# Patient Record
Sex: Female | Born: 1980 | Race: White | Hispanic: No | Smoking: Current every day smoker
Health system: Southern US, Community
[De-identification: ages and names within clinical notes are randomized; demographics above are authoritative.]

## PROBLEM LIST (undated history)

## (undated) DIAGNOSIS — F17200 Nicotine dependence, unspecified, uncomplicated: Secondary | ICD-10-CM

## (undated) DIAGNOSIS — E785 Hyperlipidemia, unspecified: Secondary | ICD-10-CM

## (undated) DIAGNOSIS — E079 Disorder of thyroid, unspecified: Secondary | ICD-10-CM

## (undated) DIAGNOSIS — E282 Polycystic ovarian syndrome: Secondary | ICD-10-CM

## (undated) HISTORY — DX: Disorder of thyroid, unspecified: E07.9

## (undated) HISTORY — DX: Hyperlipidemia, unspecified: E78.5

## (undated) HISTORY — PX: OTHER SURGICAL HISTORY: SHX169

## (undated) HISTORY — DX: Nicotine dependence, unspecified, uncomplicated: F17.200

## (undated) HISTORY — DX: Polycystic ovarian syndrome: E28.2

## (undated) HISTORY — PX: LEEP: SHX91

---
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
## 2000-04-02 ENCOUNTER — Emergency Department (HOSPITAL_COMMUNITY): Admission: EM | Admit: 2000-04-02 | Discharge: 2000-04-02 | Payer: Self-pay | Admitting: Emergency Medicine

## 2000-04-02 ENCOUNTER — Encounter: Payer: Self-pay | Admitting: Emergency Medicine

## 2002-04-17 ENCOUNTER — Encounter: Payer: Self-pay | Admitting: Family Medicine

## 2002-04-17 ENCOUNTER — Encounter: Admission: RE | Admit: 2002-04-17 | Discharge: 2002-04-17 | Payer: Self-pay | Admitting: Family Medicine

## 2002-04-20 ENCOUNTER — Other Ambulatory Visit: Admission: RE | Admit: 2002-04-20 | Discharge: 2002-04-20 | Payer: Self-pay | Admitting: Obstetrics and Gynecology

## 2002-11-13 ENCOUNTER — Other Ambulatory Visit: Admission: RE | Admit: 2002-11-13 | Discharge: 2002-11-13 | Payer: Self-pay | Admitting: Obstetrics and Gynecology

## 2003-05-25 ENCOUNTER — Other Ambulatory Visit: Admission: RE | Admit: 2003-05-25 | Discharge: 2003-05-25 | Payer: Self-pay | Admitting: Obstetrics and Gynecology

## 2012-01-01 ENCOUNTER — Ambulatory Visit: Payer: Self-pay | Admitting: Obstetrics and Gynecology

## 2012-01-04 ENCOUNTER — Ambulatory Visit: Payer: Self-pay | Admitting: Obstetrics and Gynecology

## 2012-10-13 ENCOUNTER — Ambulatory Visit
Admission: RE | Admit: 2012-10-13 | Discharge: 2012-10-13 | Disposition: A | Discharge status: Home / Self Care | Payer: PRIVATE HEALTH INSURANCE | Source: Ambulatory Visit | Attending: Obstetrics and Gynecology | Admitting: Obstetrics and Gynecology

## 2012-10-13 ENCOUNTER — Other Ambulatory Visit (HOSPITAL_COMMUNITY)
Admission: RE | Admit: 2012-10-13 | Discharge: 2012-10-13 | Disposition: A | Discharge status: Home / Self Care | Payer: PRIVATE HEALTH INSURANCE | Source: Ambulatory Visit | Attending: Obstetrics and Gynecology | Admitting: Obstetrics and Gynecology

## 2012-10-13 ENCOUNTER — Other Ambulatory Visit: Payer: Self-pay | Admitting: Obstetrics and Gynecology

## 2012-10-13 DIAGNOSIS — Z1151 Encounter for screening for human papillomavirus (HPV): Secondary | ICD-10-CM | POA: Insufficient documentation

## 2012-10-13 DIAGNOSIS — E049 Nontoxic goiter, unspecified: Secondary | ICD-10-CM

## 2012-10-13 DIAGNOSIS — Z01419 Encounter for gynecological examination (general) (routine) without abnormal findings: Secondary | ICD-10-CM | POA: Insufficient documentation

## 2013-10-19 ENCOUNTER — Other Ambulatory Visit: Payer: Self-pay | Admitting: Internal Medicine

## 2013-10-19 DIAGNOSIS — E042 Nontoxic multinodular goiter: Secondary | ICD-10-CM

## 2013-10-23 ENCOUNTER — Ambulatory Visit
Admission: RE | Admit: 2013-10-23 | Discharge: 2013-10-23 | Disposition: A | Discharge status: Home / Self Care | Payer: PRIVATE HEALTH INSURANCE | Source: Ambulatory Visit | Attending: Internal Medicine | Admitting: Internal Medicine

## 2013-10-23 DIAGNOSIS — E042 Nontoxic multinodular goiter: Secondary | ICD-10-CM

## 2014-07-24 IMAGING — US US SOFT TISSUE HEAD/NECK
1 series · 14 of 25 positions shown · non-contrast
Comparison: None.

CLINICAL DATA: Enlarged thyroid.

THYROID ULTRASOUND
TECHNIQUE: Ultrasound examination of the thyroid gland and adjacent
soft tissues was performed.

[Series 1: us soft tissue head/neck · 0.08mm/px · 14 of 50 slices shown]
[im 1/50]
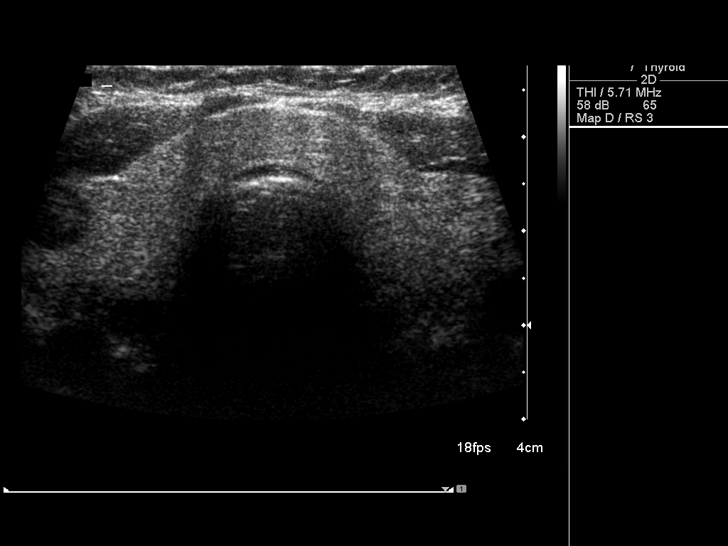
[im 5/50]
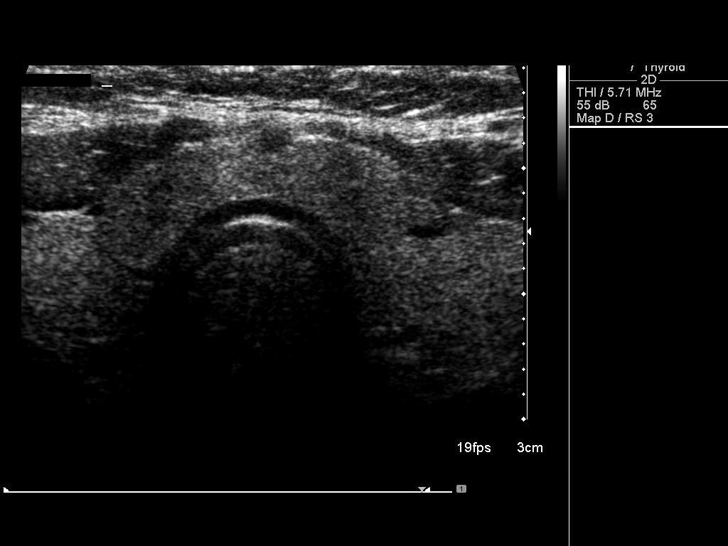
[im 9/50]
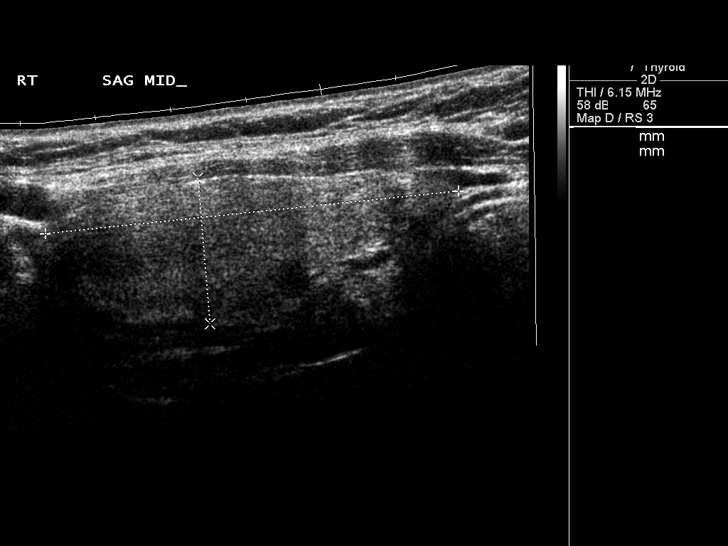
[im 13/50]
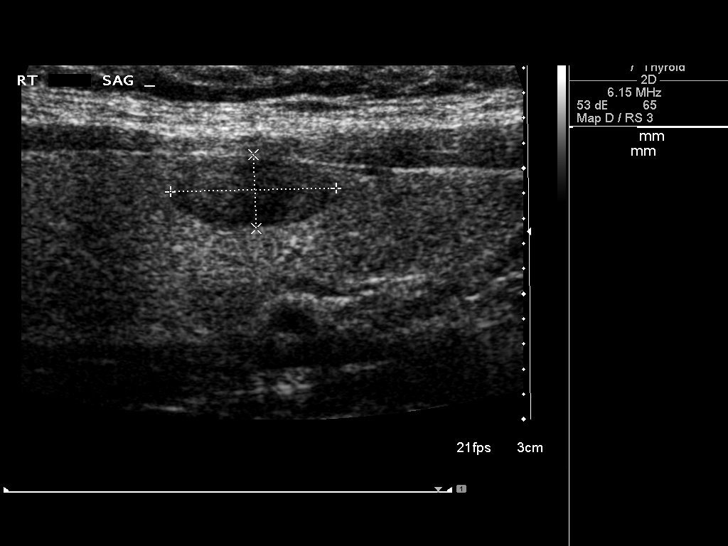
[im 17/50]
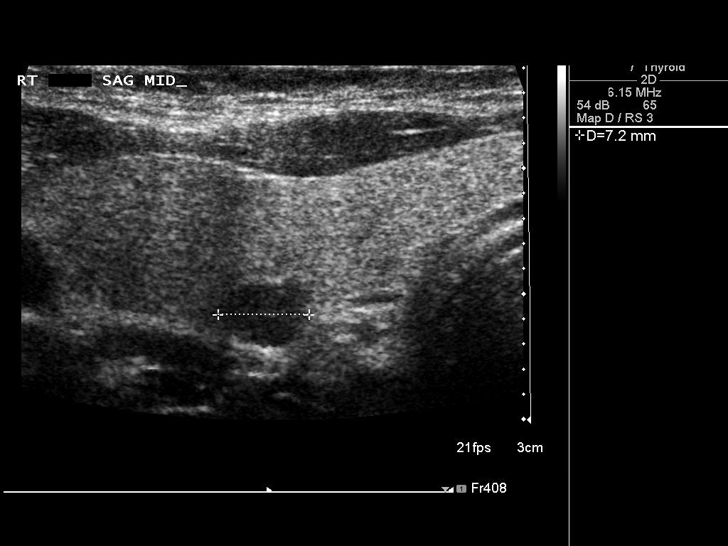
[im 19/50]
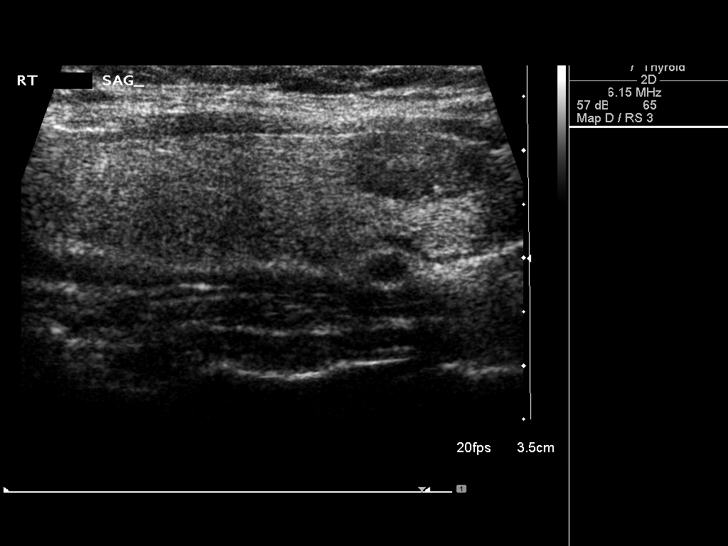
[im 23/50]
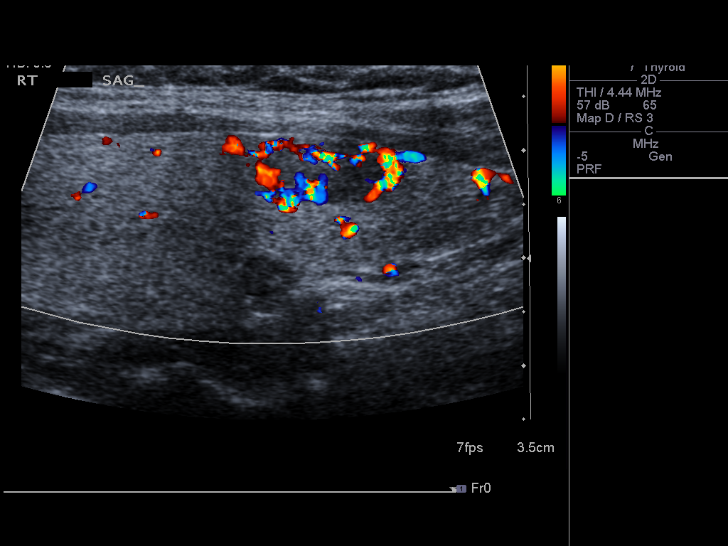
[im 27/50]
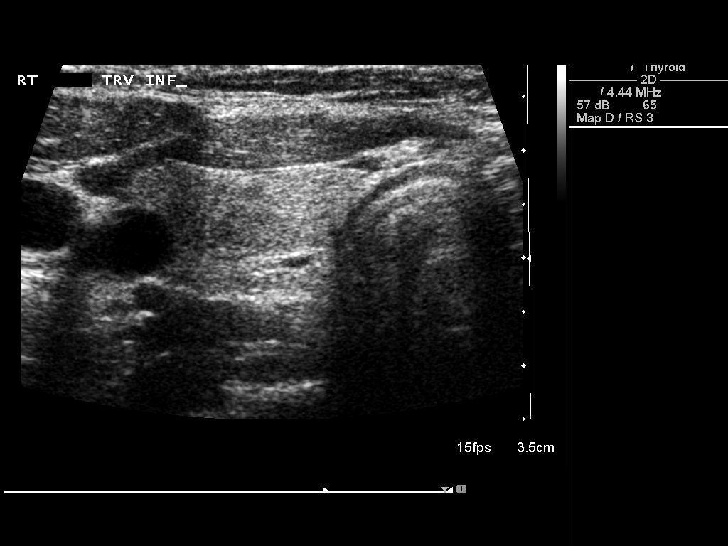
[im 31/50]
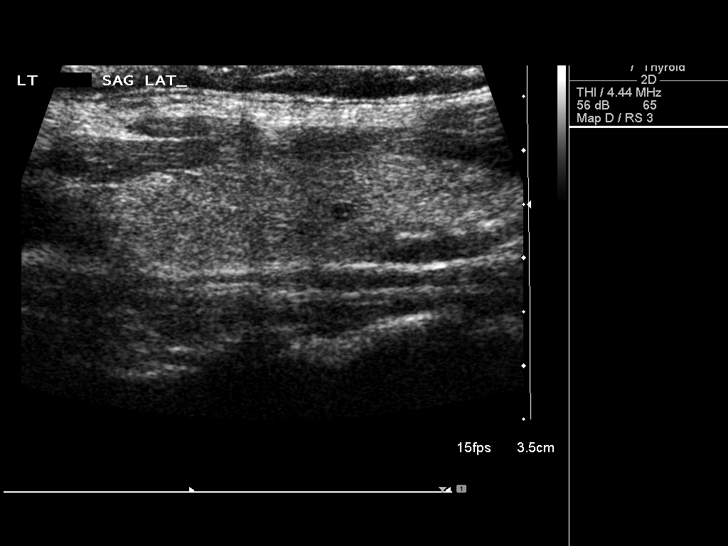
[im 33/50]
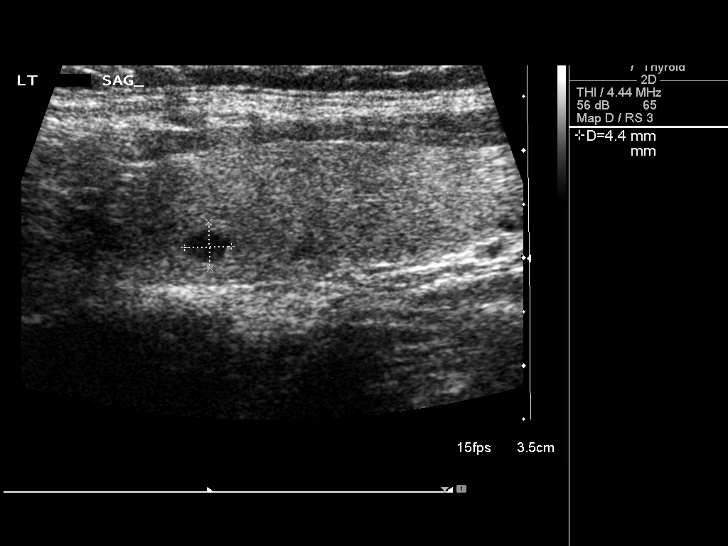
[im 37/50]
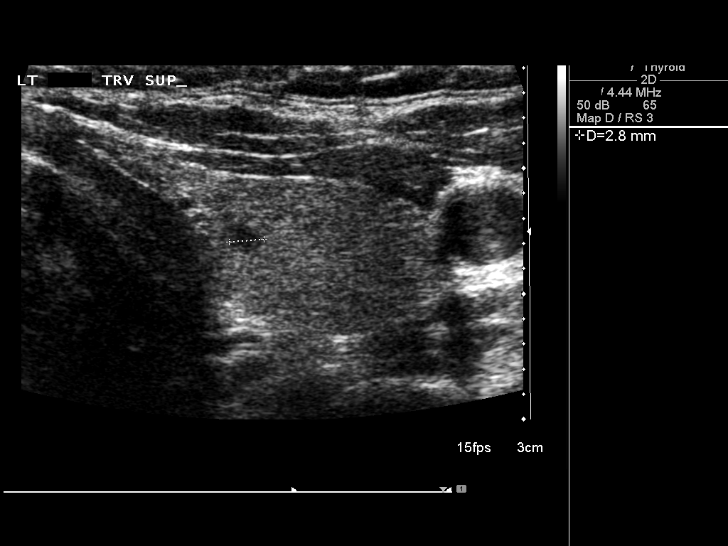
[im 41/50]
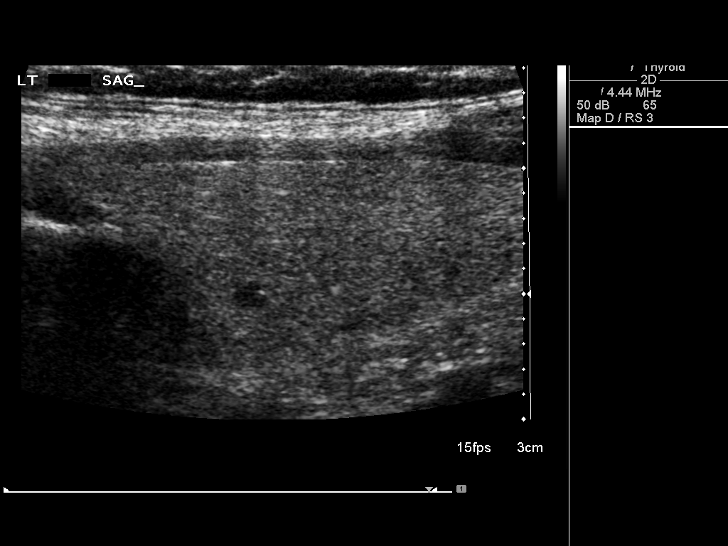
[im 45/50]
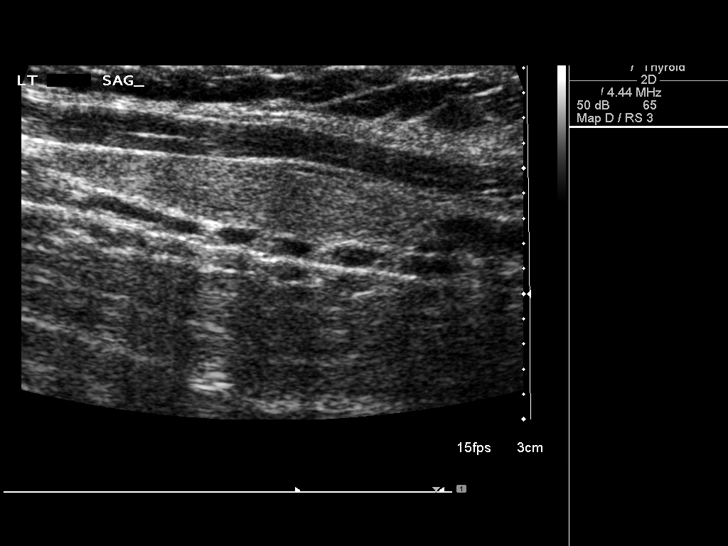
[im 50/50]
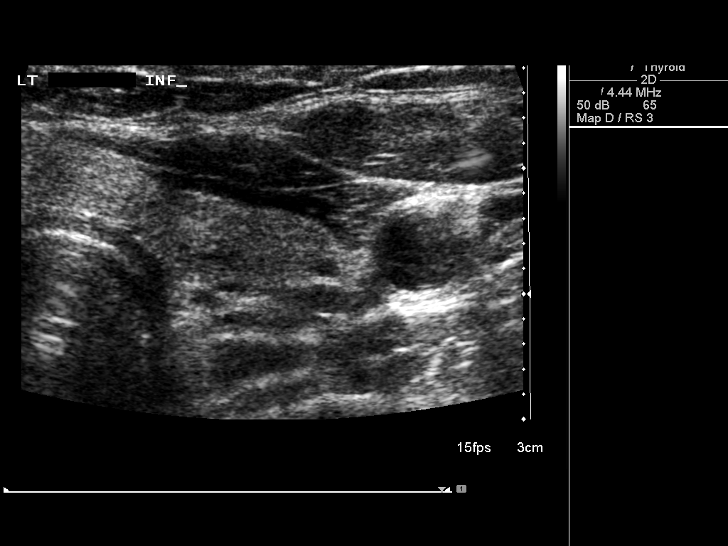

[14 of 25 positions shown; findings below may reference images not displayed]

FINDINGS: Right thyroid lobe:  Measures 5.5 x 1.9 x 2.4 cm, heterogeneous in
echotexture.
Left thyroid lobe:  Measures 5.3 x 1.5 x 2.1 cm, heterogeneous in
echotexture.
Isthmus:  Measures 7 mm.

Focal nodules:

Solid nodule, right lower pole, 1.3 x 0.6 x 1.2 cm (image 14).

Additional hypoechoic and solid nodules measure 7 mm or less in
size bilaterally.

Lymphadenopathy:  None visualized.
IMPRESSION: 1.  Slightly enlarged thyroid.
2.  Scattered bilateral thyroid nodules. Findings do not meet
current SRU consensus criteria for biopsy.  Follow-up by clinical
exam is recommended.  If patient has known risk factors for thyroid
carcinoma, consider follow-up ultrasound in 12 months.  If patient
is clinically hyperthyroid, consider nuclear medicine thyroid
uptake and scan.

## 2015-08-03 IMAGING — US US SOFT TISSUE HEAD/NECK
1 series · 14 of 25 positions shown · non-contrast
Comparison: 10/13/2012

CLINICAL DATA: Followup of thyroid goiter.

EXAM:
THYROID ULTRASOUND
TECHNIQUE: Ultrasound examination of the thyroid gland and adjacent soft
tissues was performed.

[Series 1: us soft tissue head/neck · 0.08mm/px · 14 of 61 slices shown]
[im 1/61]
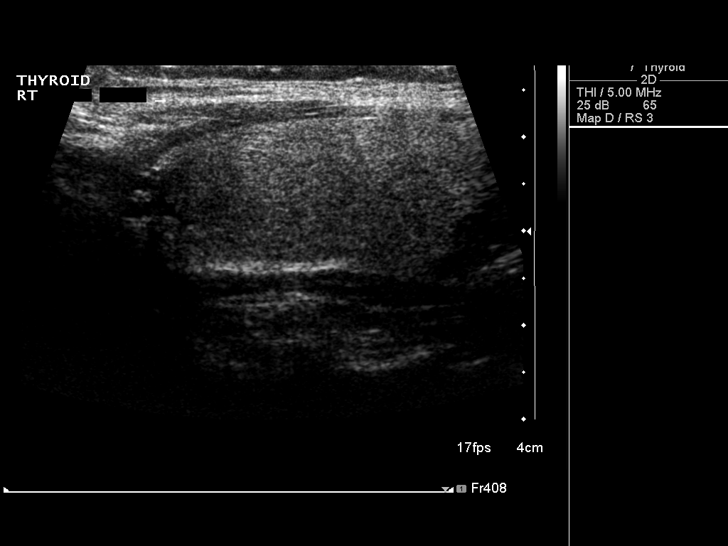
[im 6/61]
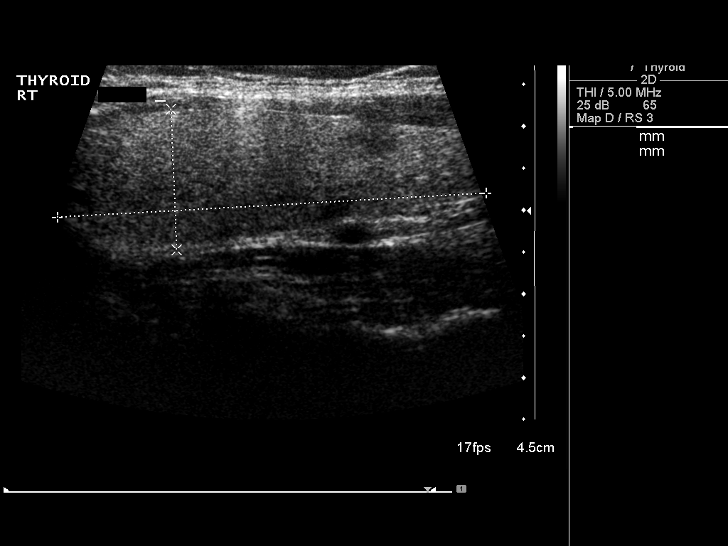
[im 11/61]
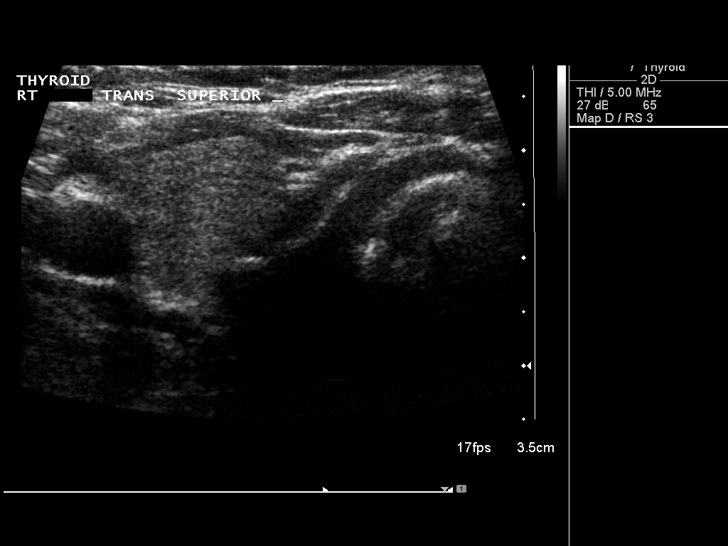
[im 16/61]
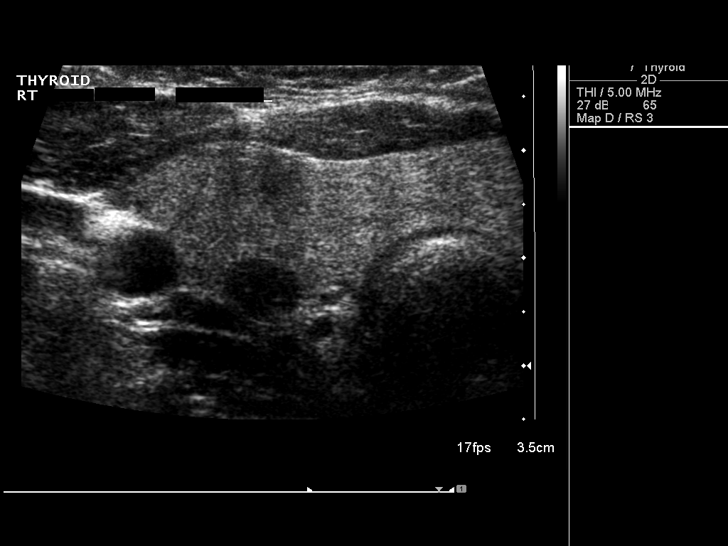
[im 21/61]
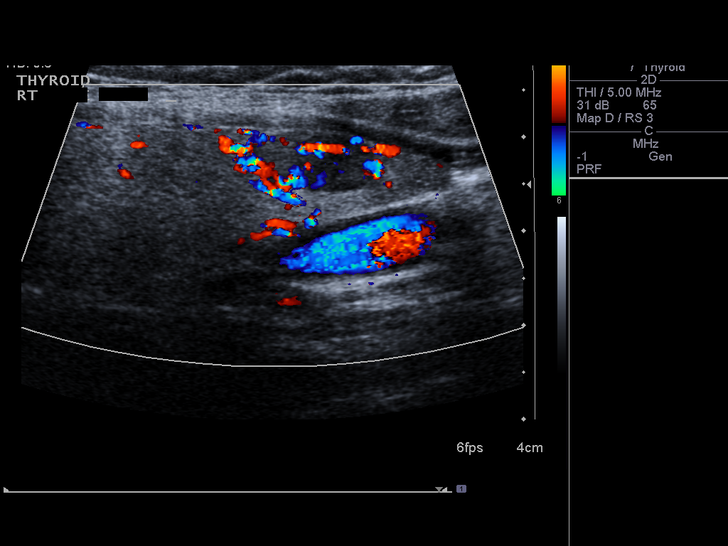
[im 23/61]
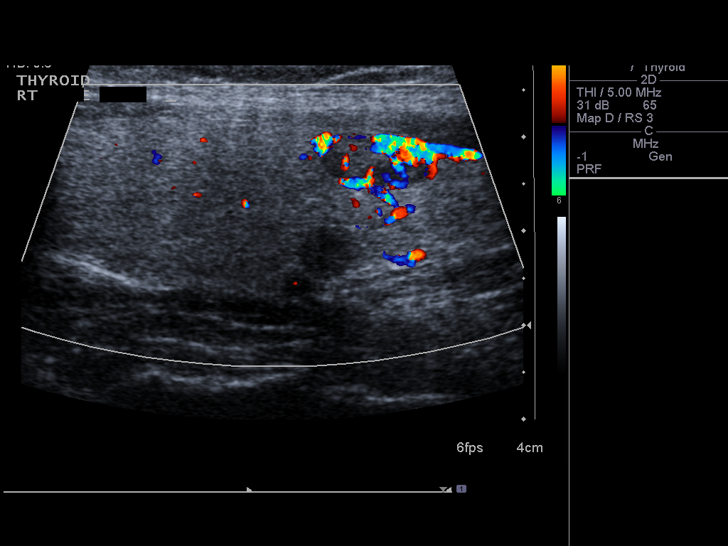
[im 28/61]
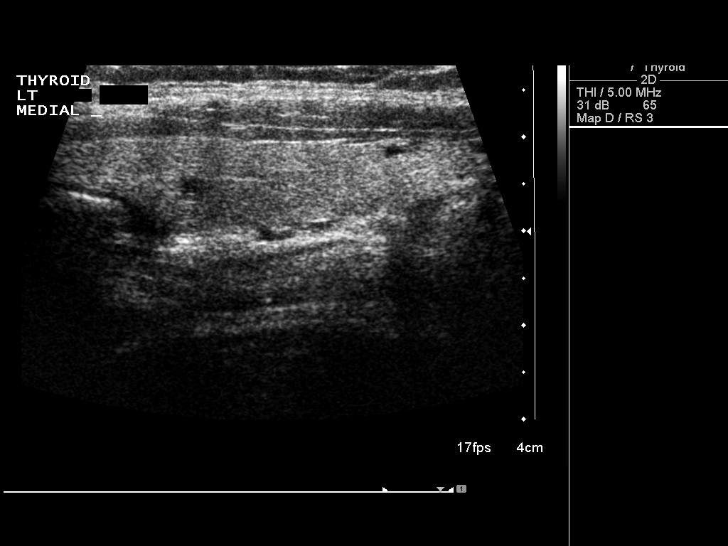
[im 33/61]
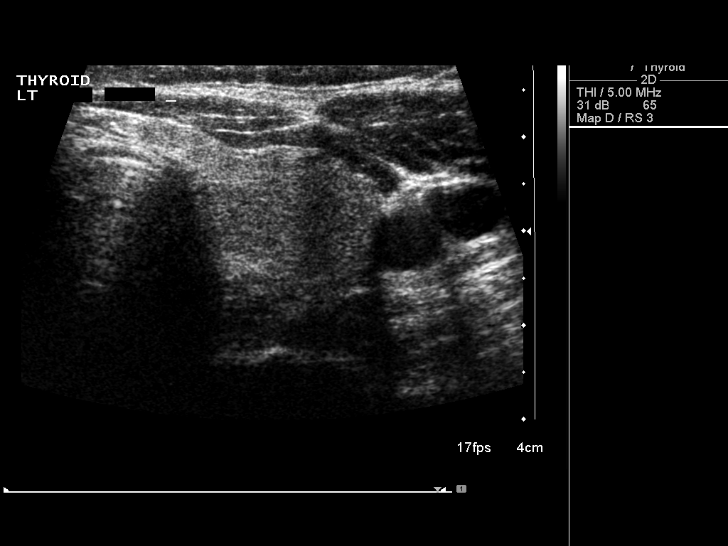
[im 38/61]
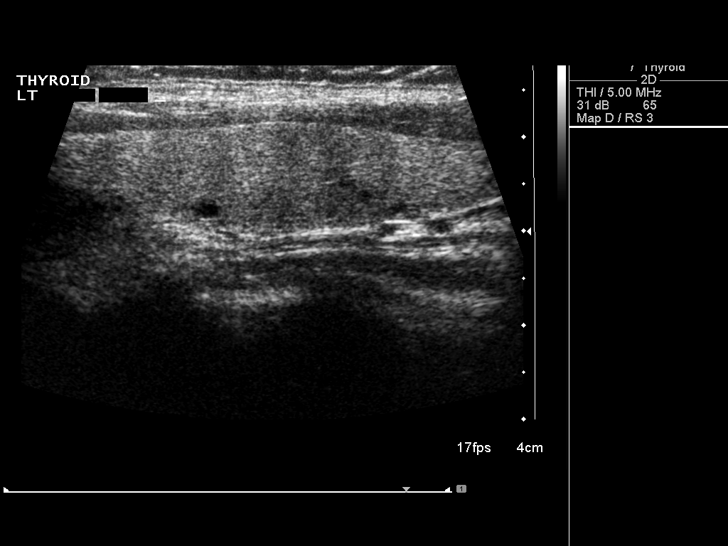
[im 41/61]
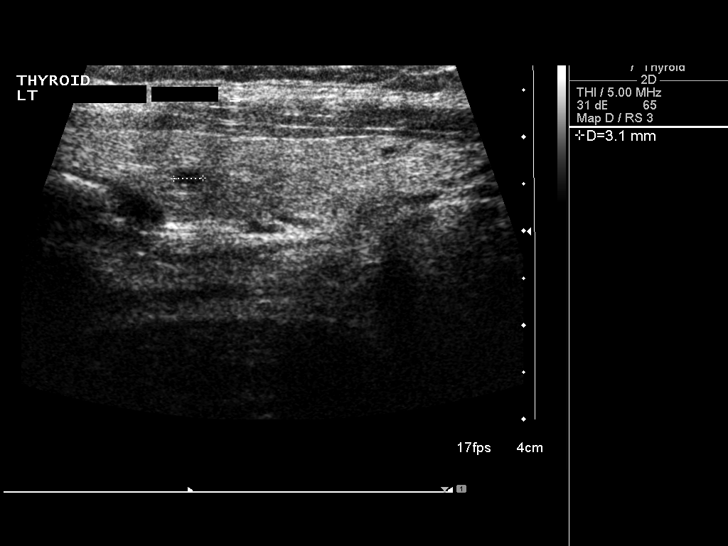
[im 46/61]
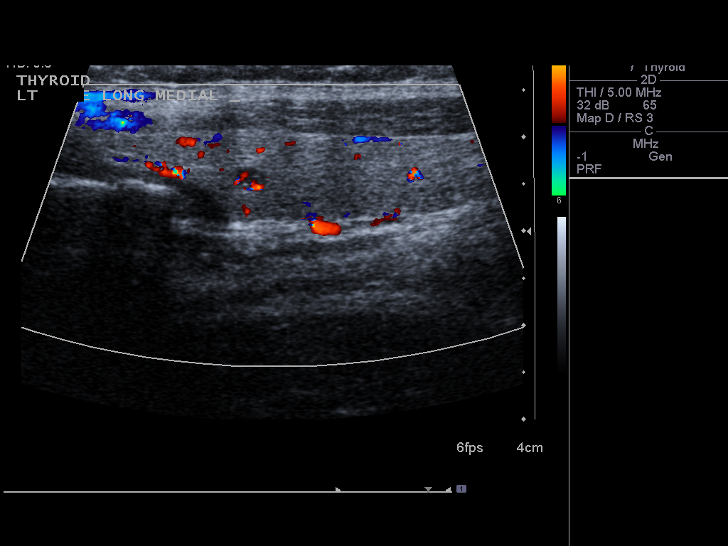
[im 51/61]
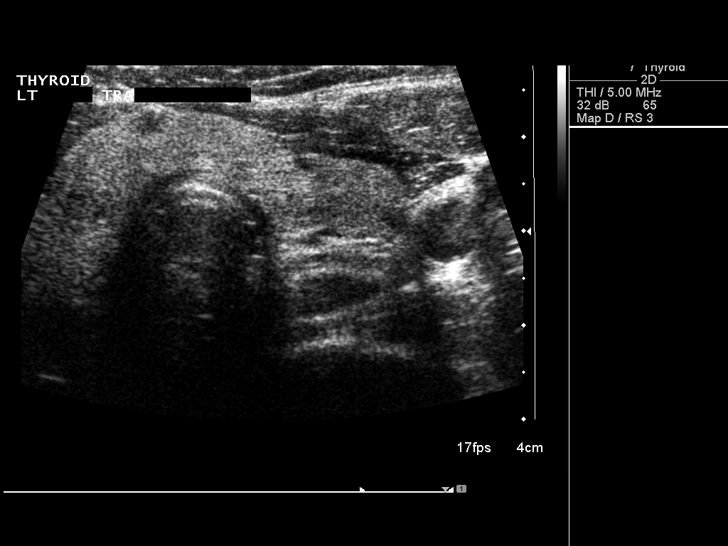
[im 56/61]
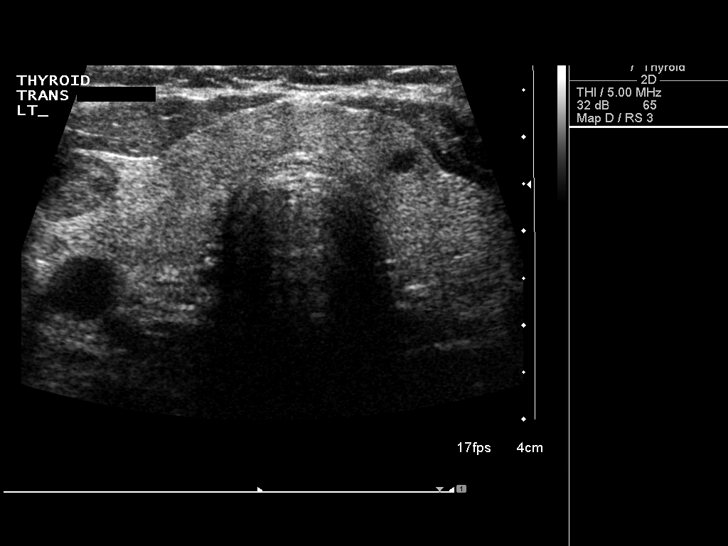
[im 61/61]
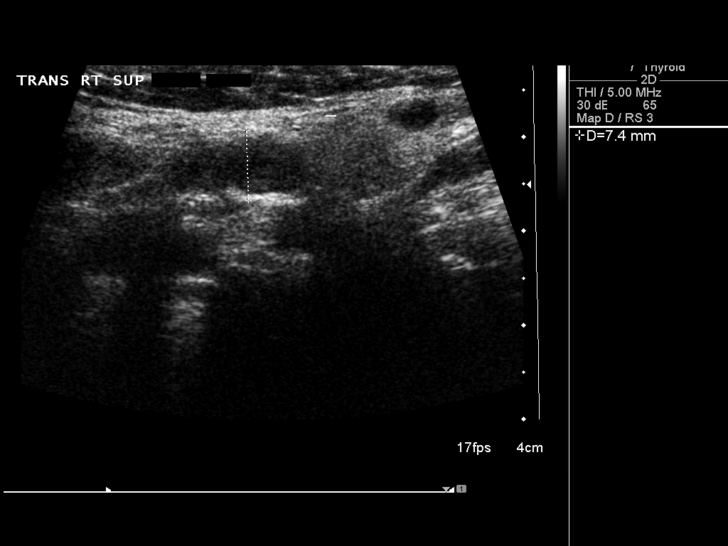

[14 of 25 positions shown; findings below may reference images not displayed]

FINDINGS: Right thyroid lobe

Measurements: 6.1 x 1.8 x 2.6 cm. Solid hypoechoic nodule in the
inferior right lobe measures approximately 1.4 x 0.7 x 1.0 cm and is
stable in size and morphology. Additional small 0.6 cm inferior
right thyroid nodule is stable.

Left thyroid lobe

Measurements: 5.0 x 1.3 x 1.9 cm. Stable small cystic nodules in the
left lobe with the largest measuring 0.6 cm in the superior left
lobe.

Isthmus

Thickness: 0.6 cm. Stable 0.4 cm midline nodule and 0.4 cm nodule on
the left near the juncture of the left lobe and isthmus.

Lymphadenopathy

None visualized.
IMPRESSION: Stable thyroid goiter demonstrating small bilateral thyroid nodules.
The dominant nodule in the inferior right lobe is stable in size and
ultrasound appearance.

## 2018-03-10 ENCOUNTER — Ambulatory Visit: Payer: BLUE CROSS/BLUE SHIELD | Admitting: Family Medicine

## 2018-03-10 ENCOUNTER — Other Ambulatory Visit: Payer: Self-pay

## 2018-03-10 ENCOUNTER — Encounter: Payer: Self-pay | Admitting: Family Medicine

## 2018-03-10 ENCOUNTER — Telehealth: Payer: Self-pay | Admitting: Family Medicine

## 2018-03-10 VITALS — BP 127/86 | HR 103 | Temp 98.3°F | Ht 68.5 in | Wt 204.0 lb

## 2018-03-10 DIAGNOSIS — E079 Disorder of thyroid, unspecified: Secondary | ICD-10-CM

## 2018-03-10 DIAGNOSIS — R079 Chest pain, unspecified: Secondary | ICD-10-CM | POA: Diagnosis not present

## 2018-03-10 DIAGNOSIS — E782 Mixed hyperlipidemia: Secondary | ICD-10-CM

## 2018-03-10 DIAGNOSIS — F339 Major depressive disorder, recurrent, unspecified: Secondary | ICD-10-CM | POA: Diagnosis not present

## 2018-03-10 DIAGNOSIS — E785 Hyperlipidemia, unspecified: Secondary | ICD-10-CM | POA: Insufficient documentation

## 2018-03-10 DIAGNOSIS — Z23 Encounter for immunization: Secondary | ICD-10-CM | POA: Diagnosis not present

## 2018-03-10 LAB — UA/M W/RFLX CULTURE, ROUTINE
BILIRUBIN UA: NEGATIVE
GLUCOSE, UA: NEGATIVE
Ketones, UA: NEGATIVE
Leukocytes, UA: NEGATIVE
NITRITE UA: NEGATIVE
PH UA: 5 (ref 5.0–7.5)
Protein, UA: NEGATIVE
Specific Gravity, UA: 1.02 (ref 1.005–1.030)
UUROB: 0.2 mg/dL (ref 0.2–1.0)

## 2018-03-10 LAB — MICROSCOPIC EXAMINATION: WBC, UA: NONE SEEN /hpf (ref 0–5)

## 2018-03-10 NOTE — Progress Notes (Signed)
BP 127/86   Pulse (!) 103   Temp 98.3 F (36.8 C) (Oral)   Ht 5' 8.5" (1.74 m)   Wt 204 lb (92.5 kg)   SpO2 97%   BMI 30.57 kg/m    Subjective:    Patient ID: Linda [name], female    DOB: 19-Aug-1980, 37 y.o.   MRN: [id number]  HPI: Linda [name] is a 37 y.o. female who presents today to establish care. Hasn't seen a regular doctor in about 4 years  Chief Complaint  Patient presents with  . New Patient (Initial Visit)    pt would like to discuss about pap smear, blood work, and depression   DEPRESSION- about a month ago she started having some bad PMS symptoms that triggered some depression. Working with a Paramedic Mood status: better Satisfied with current treatment?: yes Symptom severity: moderate  Duration of current treatment : weeks Psychotherapy/counseling: yes current Previous psychiatric medications: none Depressed mood: yes Anxious mood: yes Anhedonia: no Significant weight loss or gain: no Insomnia: no  Fatigue: yes Feelings of worthlessness or guilt: yes Impaired concentration/indecisiveness: yes Suicidal ideations: no Hopelessness: no Crying spells: yes Depression screen PHQ 2/9 03/10/2018  Decreased Interest 1  Down, Depressed, Hopeless 3  PHQ - 2 Score 4  Altered sleeping 2  Tired, decreased energy 1  Change in appetite 2  Feeling bad or failure about yourself  2  Trouble concentrating 2  Moving slowly or fidgety/restless 0  Suicidal thoughts 0  PHQ-9 Score 13    CHEST PAIN Duration:year every couple of months Onset: sudden Quality: dull aches Severity: severe Location: left para substernal Radiation: jaw Episode duration: couple of minutes Frequency: a few times a year Related to exertion: no Activity when pain started: at random Trauma: no Anxiety/recent stressors: yes Status: fluctuating Treatments attempted: nothing  Current pain status: pain free Shortness of breath: no Cough: no Nausea: no Diaphoresis: no Heartburn:  no Palpitations: yes- not associated with that  Active Ambulatory Problems    Diagnosis Date Noted  . Hyperlipidemia   . Thyroid disease   . Depression, recurrent (HCC) 03/10/2018   Resolved Ambulatory Problems    Diagnosis Date Noted  . No Resolved Ambulatory Problems   Past Medical History:  Diagnosis Date  . PCOS (polycystic ovarian syndrome)   . Smoker    Past Surgical History:  Procedure Laterality Date  . lymph nodes surgery      Outpatient Encounter Medications as of 03/10/2018  Medication Sig  . Multiple Vitamins-Minerals (MULTIVITAMIN PO) Take by mouth daily.  . S-Adenosylmethionine (SAME) 400 MG TABS Take by mouth daily.   No facility-administered encounter medications on file as of 03/10/2018.    No Known Allergies  Social History   Socioeconomic History  . Marital status: Single    Spouse name: Not on file  . Number of children: Not on file  . Years of education: Not on file  . Highest education level: Not on file  Occupational History  . Not on file  Social Needs  . Financial resource strain: Not on file  . Food insecurity:    Worry: Not on file    Inability: Not on file  . Transportation needs:    Medical: Not on file    Non-medical: Not on file  Tobacco Use  . Smoking status: Current Every Day Smoker    Packs/day: 1.00  . Smokeless tobacco: Never Used  Substance and Sexual Activity  . Alcohol use: Yes  Alcohol/week: 2.0 standard drinks    Types: 2 Glasses of wine per week  . Drug use: Never  . Sexual activity: Yes  Lifestyle  . Physical activity:    Days per week: Not on file    Minutes per session: Not on file  . Stress: Not on file  Relationships  . Social connections:    Talks on phone: Not on file    Gets together: Not on file    Attends religious service: Not on file    Active member of club or organization: Not on file    Attends meetings of clubs or organizations: Not on file    Relationship status: Not on file  Other  Topics Concern  . Not on file  Social History Narrative  . Not on file   Family History  Problem Relation Age of Onset  . Dementia Paternal Grandmother     Review of Systems  Constitutional: Negative.   Respiratory: Negative.   Cardiovascular: Positive for chest pain. Negative for palpitations and leg swelling.  Gastrointestinal: Negative.   Psychiatric/Behavioral: Positive for dysphoric mood. Negative for agitation, behavioral problems, confusion, decreased concentration, hallucinations, self-injury, sleep disturbance and suicidal ideas. The patient is not nervous/anxious and is not hyperactive.     Per HPI unless specifically indicated above     Objective:    BP 127/86   Pulse (!) 103   Temp 98.3 F (36.8 C) (Oral)   Ht 5' 8.5" (1.74 m)   Wt 204 lb (92.5 kg)   SpO2 97%   BMI 30.57 kg/m   Wt Readings from Last 3 Encounters:  03/10/18 204 lb (92.5 kg)    Physical Exam  Constitutional: She is oriented to person, place, and time. She appears well-developed and well-nourished. No distress.  HENT:  Head: Normocephalic and atraumatic.  Right Ear: Hearing normal.  Left Ear: Hearing normal.  Nose: Nose normal.  Eyes: Conjunctivae and lids are normal. Right eye exhibits no discharge. Left eye exhibits no discharge. No scleral icterus.  Cardiovascular: Normal rate, regular rhythm, normal heart sounds and intact distal pulses. Exam reveals no gallop and no friction rub.  No murmur heard. Pulmonary/Chest: Effort normal and breath sounds normal. No stridor. No respiratory distress. She has no wheezes. She has no rales. She exhibits no tenderness.  Musculoskeletal: Normal range of motion.  Neurological: She is alert and oriented to person, place, and time.  Skin: Skin is warm, dry and intact. No rash noted. She is not diaphoretic. No erythema. No pallor.  Psychiatric: She has a normal mood and affect. Her speech is normal and behavior is normal. Judgment and thought content  normal. Cognition and memory are normal.  Nursing note and vitals reviewed.   No results found for this or any previous visit.    Assessment & Plan:   Problem List Items Addressed This Visit      Endocrine   Thyroid disease    Has been on medicine in past, but hasn't been on any recently. Call with any concerns. Checking labs today. Await results.       Relevant Orders   Comprehensive metabolic panel   TSH   UA/M w/rflx Culture, Routine     Other   Hyperlipidemia    Has been on medicine in past, but hasn't been on any recently. Call with any concerns. Checking labs today. Await results.       Relevant Orders   Comprehensive metabolic panel   Lipid Panel w/o Chol/HDL Ratio  UA/M w/rflx Culture, Routine   Depression, recurrent (HCC) - Primary    Doing better with counseling. Continue to monitor. Call with any concerns or if she decides she wants medicine.       Relevant Orders   CBC with Differential/Platelet   Comprehensive metabolic panel   UA/M w/rflx Culture, Routine   VITAMIN D 25 Hydroxy (Vit-D Deficiency, Fractures)    Other Visit Diagnoses    Flu vaccine need       Flu shot given today   Relevant Orders   Flu Vaccine QUAD 36+ mos IM (Completed)   Need for Tdap vaccination       Tdap given today   Relevant Orders   Tdap vaccine greater than or equal to 7yo IM (Completed)   Chest pain, unspecified type       EKG normal. Continue to monitor. Call with any concerns.    Relevant Orders   EKG 12-Lead (Completed)       Follow up plan: Return ASAP Physical, for Records release [employer], [place].

## 2018-03-10 NOTE — Assessment & Plan Note (Signed)
Doing better with counseling. Continue to monitor. Call with any concerns or if she decides she wants medicine.

## 2018-03-10 NOTE — Patient Instructions (Addendum)
Tdap Vaccine (Tetanus, Diphtheria and Pertussis): What You Need to Know 1. Why get vaccinated? Tetanus, diphtheria and pertussis are very serious diseases. Tdap vaccine can protect us from these diseases. And, Tdap vaccine given to pregnant women can protect newborn babies against pertussis. TETANUS (Lockjaw) is rare in the [place] today. It causes painful muscle tightening and stiffness, usually all over the body.  It can lead to tightening of muscles in the head and neck so you can't open your mouth, swallow, or sometimes even breathe. Tetanus kills about 1 out of 10 people who are infected even after receiving the best medical care.  DIPHTHERIA is also rare in the [place] today. It can cause a thick coating to form in the back of the throat.  It can lead to breathing problems, heart failure, paralysis, and death.  PERTUSSIS (Whooping Cough) causes severe coughing spells, which can cause difficulty breathing, vomiting and disturbed sleep.  It can also lead to weight loss, incontinence, and rib fractures. Up to 2 in 100 adolescents and 5 in 100 adults with pertussis are hospitalized or have complications, which could include pneumonia or death.  These diseases are caused by bacteria. Diphtheria and pertussis are spread from person to person through secretions from coughing or sneezing. Tetanus enters the body through cuts, scratches, or wounds. Before vaccines, as many as 200,000 cases of diphtheria, 200,000 cases of pertussis, and hundreds of cases of tetanus, were reported in the [place] each year. Since vaccination began, reports of cases for tetanus and diphtheria have dropped by about 99% and for pertussis by about 80%. 2. Tdap vaccine Tdap vaccine can protect adolescents and adults from tetanus, diphtheria, and pertussis. One dose of Tdap is routinely given at age 11 or 12. People who did not get Tdap at that age should get it as soon as possible. Tdap is especially  important for healthcare professionals and anyone having close contact with a baby younger than 12 months. Pregnant women should get a dose of Tdap during every pregnancy, to protect the newborn from pertussis. Infants are most at risk for severe, life-threatening complications from pertussis. Another vaccine, called Td, protects against tetanus and diphtheria, but not pertussis. A Td booster should be given every 10 years. Tdap may be given as one of these boosters if you have never gotten Tdap before. Tdap may also be given after a severe cut or burn to prevent tetanus infection. Your doctor or the person giving you the vaccine can give you more information. Tdap may safely be given at the same time as other vaccines. 3. Some people should not get this vaccine  A person who has ever had a life-threatening allergic reaction after a previous dose of any diphtheria, tetanus or pertussis containing vaccine, OR has a severe allergy to any part of this vaccine, should not get Tdap vaccine. Tell the person giving the vaccine about any severe allergies.  Anyone who had coma or long repeated seizures within 7 days after a childhood dose of DTP or DTaP, or a previous dose of Tdap, should not get Tdap, unless a cause other than the vaccine was found. They can still get Td.  Talk to your doctor if you: ? have seizures or another nervous system problem, ? had severe pain or swelling after any vaccine containing diphtheria, tetanus or pertussis, ? ever had a condition called Guillain-Barr Syndrome (GBS), ? aren't feeling well on the day the shot is scheduled. 4. Risks With any medicine, including   vaccines, there is a chance of side effects. These are usually mild and go away on their own. Serious reactions are also possible but are rare. Most people who get Tdap vaccine do not have any problems with it. Mild problems following Tdap: (Did not interfere with activities)  Pain where the shot was given (about  3 in 4 adolescents or 2 in 3 adults)  Redness or swelling where the shot was given (about 1 person in 5)  Mild fever of at least 100.4F (up to about 1 in 25 adolescents or 1 in 100 adults)  Headache (about 3 or 4 people in 10)  Tiredness (about 1 person in 3 or 4)  Nausea, vomiting, diarrhea, stomach ache (up to 1 in 4 adolescents or 1 in 10 adults)  Chills, sore joints (about 1 person in 10)  Body aches (about 1 person in 3 or 4)  Rash, swollen glands (uncommon)  Moderate problems following Tdap: (Interfered with activities, but did not require medical attention)  Pain where the shot was given (up to 1 in 5 or 6)  Redness or swelling where the shot was given (up to about 1 in 16 adolescents or 1 in 12 adults)  Fever over 102F (about 1 in 100 adolescents or 1 in 250 adults)  Headache (about 1 in 7 adolescents or 1 in 10 adults)  Nausea, vomiting, diarrhea, stomach ache (up to 1 or 3 people in 100)  Swelling of the entire arm where the shot was given (up to about 1 in 500).  Severe problems following Tdap: (Unable to perform usual activities; required medical attention)  Swelling, severe pain, bleeding and redness in the arm where the shot was given (rare).  Problems that could happen after any vaccine:  People sometimes faint after a medical procedure, including vaccination. Sitting or lying down for about 15 minutes can help prevent fainting, and injuries caused by a fall. Tell your doctor if you feel dizzy, or have vision changes or ringing in the ears.  Some people get severe pain in the shoulder and have difficulty moving the arm where a shot was given. This happens very rarely.  Any medication can cause a severe allergic reaction. Such reactions from a vaccine are very rare, estimated at fewer than 1 in a million doses, and would happen within a few minutes to a few hours after the vaccination. As with any medicine, there is a very remote chance of a vaccine  causing a serious injury or death. The safety of vaccines is always being monitored. For more information, visit: www.cdc.gov/vaccinesafety/ 5. What if there is a serious problem? What should I look for? Look for anything that concerns you, such as signs of a severe allergic reaction, very high fever, or unusual behavior. Signs of a severe allergic reaction can include hives, swelling of the face and throat, difficulty breathing, a fast heartbeat, dizziness, and weakness. These would usually start a few minutes to a few hours after the vaccination. What should I do?  If you think it is a severe allergic reaction or other emergency that can't wait, call 9-1-1 or get the person to the nearest hospital. Otherwise, call your doctor.  Afterward, the reaction should be reported to the Vaccine Adverse Event Reporting System (VAERS). Your doctor might file this report, or you can do it yourself through the VAERS web site at www.vaers.hhs.gov, or by calling 1-800-822-7967. ? VAERS does not give medical advice. 6. The National Vaccine Injury Compensation Program The National   Vaccine Injury Compensation Program (VICP) is a federal program that was created to compensate people who may have been injured by certain vaccines. Persons who believe they may have been injured by a vaccine can learn about the program and about filing a claim by calling 1-(279)779-0637 or visiting the VICP website at SpiritualWord.at. There is a time limit to file a claim for compensation. 7. How can I learn more?  Ask your doctor. He or she can give you the vaccine package insert or suggest other sources of information.  Call your local or state health department.  Contact the Centers for Disease Control and Prevention (CDC): ? Call 469-644-6446 (1-800-CDC-INFO) or ? Visit CDC's website at PicCapture.uy CDC Tdap Vaccine VIS (08/11/13) This information is not intended to replace advice given to you by your  health care provider. Make sure you discuss any questions you have with your health care provider. Document Released: 12/04/2011 Document Revised: 02/23/2016 Document Reviewed: 02/23/2016 Elsevier Interactive Patient Education  2017 [employer] Fluoxetine capsules, weekly [Depression/Mood Disorders] What is this medicine? FLUOXETINE (floo OX e teen) belongs to a class of drugs known as selective serotonin reuptake inhibitors (SSRIs). The weekly capsules can treat mood problems such as depression. This medicine may be used for other purposes; ask your health care provider or pharmacist if you have questions. COMMON BRAND NAME(S): Prozac Weekly What should I tell my health care provider before I take this medicine? They need to know if you have any of these conditions: -bipolar disorder or a family history of bipolar disorder -bleeding disorders -glaucoma -heart disease -liver disease -low levels of sodium in the blood -seizures -suicidal thoughts, plans, or attempt; a previous suicide attempt by you or a family member -take MAOIs like Carbex, Eldepryl, Marplan, Nardil, and Parnate -take medicines that treat or prevent blood clots -thyroid disease -an unusual or allergic reaction to fluoxetine, other medicines, foods, dyes, or preservatives -pregnant or trying to get pregnant -breast-feeding How should I use this medicine? Take this medicine by mouth with a glass of water. Follow the directions on the prescription label. Do not cut, crush, or chew this medicine. You can take this medicine with or without food. Take it on the same day of the week each week. Do not take your medicine more often than directed. Do not stop taking this medicine suddenly except upon the advice of your doctor. Stopping this medicine too quickly may cause serious side effects or your condition may worsen. A special MedGuide will be given to you by the pharmacist with each prescription and refill. Be sure to read  this information carefully each time. Talk to your pediatrician regarding the use of this medicine in children. Special care may be needed. Overdosage: If you think you have taken too much of this medicine contact a poison control center or emergency room at once. NOTE: This medicine is only for you. Do not share this medicine with others. What if I miss a dose? Try not to miss your scheduled weekly dose. You may want to mark a calendar to remind you. If you miss a dose, and it is still the day you normally take your medicine each week, then take it as soon as you can. If it is another day, then take the dose as soon as you remember and then adjust your weekly doses to the new day of the week. Do not take double or extra doses. There should be one week between each dose. What may interact with this  medicine? Do not take this medicine with any of the following medications: -other medicines containing fluoxetine, like Sarafem or Symbyax -cisapride -linezolid -MAOIs like Carbex, Eldepryl, Marplan, Nardil, and Parnate -methylene blue (injected into a vein) -pimozide -thioridazine This medicine may also interact with the following medications: -alcohol -amphetamines -aspirin and aspirin-like medicines -carbamazepine -certain medicines for depression, anxiety, or psychotic disturbances -certain medicines for migraine headaches like almotriptan, eletriptan, frovatriptan, naratriptan, rizatriptan, sumatriptan, zolmitriptan -digoxin -diuretics -fentanyl -flecainide -furazolidone -isoniazid -lithium -medicines for sleep -medicines that treat or prevent blood clots like warfarin, enoxaparin, and dalteparin -NSAIDs, medicines for pain and inflammation, like ibuprofen or naproxen -phenytoin -procarbazine -propafenone -rasagiline -ritonavir -supplements like St. John's wort, kava kava, valerian -tramadol -tryptophan -vinblastine This list may not describe all possible interactions. Give  your health care provider a list of all the medicines, herbs, non-prescription drugs, or dietary supplements you use. Also tell them if you smoke, drink alcohol, or use illegal drugs. Some items may interact with your medicine. What should I watch for while using this medicine? Tell your doctor if your symptoms do not get better or if they get worse. Visit your doctor or health care professional for regular checks on your progress. Because it may take several weeks to see the full effects of this medicine, it is important to continue your treatment as prescribed by your doctor. Patients and their families should watch out for new or worsening thoughts of suicide or depression. Also watch out for sudden changes in feelings such as feeling anxious, agitated, panicky, irritable, hostile, aggressive, impulsive, severely restless, overly excited and hyperactive, or not being able to sleep. If this happens, especially at the beginning of treatment or after a change in dose, call your health care professional. Bonita Quin may get drowsy or dizzy. Do not drive, use machinery, or do anything that needs mental alertness until you know how this medicine affects you. Do not stand or sit up quickly, especially if you are an older patient. This reduces the risk of dizzy or fainting spells. Alcohol may interfere with the effect of this medicine. Avoid alcoholic drinks. Your mouth may get dry. Chewing sugarless gum or sucking hard candy, and drinking plenty of water may help. Contact your doctor if the problem does not go away or is severe. This medicine may affect blood sugar levels. If you have diabetes, check with your doctor or health care professional before you change your diet or the dose of your diabetic medicine. What side effects may I notice from receiving this medicine? Side effects that you should report to your doctor or health care professional as soon as possible: -allergic reactions like skin rash, itching or  hives, swelling of the face, lips, or tongue -anxious -black, tarry stools -breathing problems -changes in vision -confusion -elevated mood, decreased need for sleep, racing thoughts, impulsive behavior -eye pain -fast, irregular heartbeat -feeling faint or lightheaded, falls -feeling agitated, angry, or irritable -hallucination, loss of contact with reality -loss of balance or coordination -loss of memory -painful or prolonged erections -restlessness, pacing, inability to keep still -seizures -stiff muscles -suicidal thoughts or other mood changes -trouble sleeping -unusual bleeding or bruising -unusually weak or tired -vomiting Side effects that usually do not require medical attention (report to your doctor or health care professional if they continue or are bothersome): -change in appetite or weight -change in sex drive or performance -diarrhea -dry mouth -headache -increased sweating -indigestion, nausea -tremors This list may not describe all possible side effects. Call your doctor  for medical advice about side effects. You may report side effects to FDA at 1-800-FDA-1088. Where should I keep my medicine? Keep out of the reach of children. Store at room temperature between 15 and 30 degrees C (59 and 86 degrees F). Throw away any unused medicine after the expiration date. NOTE: This sheet is a summary. It may not cover all possible information. If you have questions about this medicine, talk to your doctor, pharmacist, or health care provider.  2018 Elsevier/Gold Standard (2015-11-05 16:03:58)

## 2018-03-10 NOTE — Telephone Encounter (Signed)
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    [facility] [address]  Thank you

## 2018-03-10 NOTE — Assessment & Plan Note (Signed)
Has been on medicine in past, but hasn't been on any recently. Call with any concerns. Checking labs today. Await results.

## 2018-03-10 NOTE — Assessment & Plan Note (Signed)
Has been on medicine in past, but hasn't been on any recently. Call with any concerns. Checking labs today. Await results.  

## 2018-03-11 LAB — COMPREHENSIVE METABOLIC PANEL
A/G RATIO: 1.8 (ref 1.2–2.2)
ALBUMIN: 4.4 g/dL (ref 3.5–5.5)
ALK PHOS: 67 IU/L (ref 39–117)
ALT: 19 IU/L (ref 0–32)
AST: 16 IU/L (ref 0–40)
BILIRUBIN TOTAL: 0.3 mg/dL (ref 0.0–1.2)
BUN / CREAT RATIO: 13 (ref 9–23)
BUN: 10 mg/dL (ref 6–20)
CHLORIDE: 105 mmol/L (ref 96–106)
CO2: 16 mmol/L — ABNORMAL LOW (ref 20–29)
Calcium: 9.5 mg/dL (ref 8.7–10.2)
Creatinine, Ser: 0.77 mg/dL (ref 0.57–1.00)
GFR calc Af Amer: 114 mL/min/{1.73_m2} (ref >59)
GFR calc non Af Amer: 99 mL/min/{1.73_m2} (ref >59)
GLUCOSE: 83 mg/dL (ref 65–99)
Globulin, Total: 2.4 g/dL (ref 1.5–4.5)
POTASSIUM: 4.5 mmol/L (ref 3.5–5.2)
Sodium: 141 mmol/L (ref 134–144)
Total Protein: 6.8 g/dL (ref 6.0–8.5)

## 2018-03-11 LAB — CBC WITH DIFFERENTIAL/PLATELET
BASOS ABS: 0.1 10*3/uL (ref 0.0–0.2)
BASOS: 0 %
EOS (ABSOLUTE): 0.3 10*3/uL (ref 0.0–0.4)
EOS: 3 %
HEMOGLOBIN: 15.8 g/dL (ref 11.1–15.9)
Hematocrit: 47 % — ABNORMAL HIGH (ref 34.0–46.6)
Immature Grans (Abs): 0.1 10*3/uL (ref 0.0–0.1)
Immature Granulocytes: 1 %
Lymphocytes Absolute: 3.3 10*3/uL — ABNORMAL HIGH (ref 0.7–3.1)
Lymphs: 30 %
MCH: 31.6 pg (ref 26.6–33.0)
MCHC: 33.6 g/dL (ref 31.5–35.7)
MCV: 94 fL (ref 79–97)
MONOCYTES: 7 %
Monocytes Absolute: 0.8 10*3/uL (ref 0.1–0.9)
NEUTROS ABS: 6.6 10*3/uL (ref 1.4–7.0)
Neutrophils: 59 %
PLATELETS: 261 10*3/uL (ref 150–450)
RBC: 5 x10E6/uL (ref 3.77–5.28)
RDW: 13 % (ref 12.3–15.4)
WBC: 11.2 10*3/uL — ABNORMAL HIGH (ref 3.4–10.8)

## 2018-03-11 LAB — TSH: TSH: 0.664 u[IU]/mL (ref 0.450–4.500)

## 2018-03-11 LAB — LIPID PANEL W/O CHOL/HDL RATIO
Cholesterol, Total: 201 mg/dL — ABNORMAL HIGH (ref 100–199)
HDL: 47 mg/dL (ref >39)
LDL Calculated: 102 mg/dL — ABNORMAL HIGH (ref 0–99)
Triglycerides: 262 mg/dL — ABNORMAL HIGH (ref 0–149)
VLDL CHOLESTEROL CAL: 52 mg/dL — AB (ref 5–40)

## 2018-03-11 LAB — VITAMIN D 25 HYDROXY (VIT D DEFICIENCY, FRACTURES): VIT D 25 HYDROXY: 28 ng/mL — AB (ref 30.0–100.0)

## 2018-03-13 ENCOUNTER — Encounter: Payer: Self-pay | Admitting: Family Medicine

## 2018-03-17 MED ORDER — CICLOPIROX 8 % EX SOLN: CUTANEOUS | 3 refills | Status: DC

## 2018-03-31 ENCOUNTER — Encounter: Payer: Self-pay | Admitting: Family Medicine

## 2018-03-31 MED ORDER — BUPROPION HCL ER (SR) 150 MG PO TB12: ORAL_TABLET | ORAL | 1 refills | Status: DC

## 2018-04-04 ENCOUNTER — Ambulatory Visit (INDEPENDENT_AMBULATORY_CARE_PROVIDER_SITE_OTHER): Payer: BLUE CROSS/BLUE SHIELD | Admitting: Family Medicine

## 2018-04-04 ENCOUNTER — Other Ambulatory Visit: Payer: Self-pay

## 2018-04-04 ENCOUNTER — Encounter: Payer: Self-pay | Admitting: Family Medicine

## 2018-04-04 ENCOUNTER — Other Ambulatory Visit (HOSPITAL_COMMUNITY)
Admission: RE | Admit: 2018-04-04 | Discharge: 2018-04-04 | Disposition: A | Discharge status: Home / Self Care | Payer: BLUE CROSS/BLUE SHIELD | Source: Ambulatory Visit | Attending: Family Medicine | Admitting: Family Medicine

## 2018-04-04 VITALS — BP 125/92 | HR 96 | Temp 99.3°F | Ht 69.0 in | Wt 205.0 lb

## 2018-04-04 DIAGNOSIS — N898 Other specified noninflammatory disorders of vagina: Secondary | ICD-10-CM | POA: Diagnosis not present

## 2018-04-04 DIAGNOSIS — Z720 Tobacco use: Secondary | ICD-10-CM | POA: Insufficient documentation

## 2018-04-04 DIAGNOSIS — Z0000 Encounter for general adult medical examination without abnormal findings: Secondary | ICD-10-CM

## 2018-04-04 DIAGNOSIS — Z124 Encounter for screening for malignant neoplasm of cervix: Secondary | ICD-10-CM

## 2018-04-04 LAB — WET PREP FOR TRICH, YEAST, CLUE
Clue Cell Exam: POSITIVE — AB
Trichomonas Exam: NEGATIVE
Yeast Exam: NEGATIVE

## 2018-04-04 NOTE — Progress Notes (Signed)
BP (!) 125/92   Pulse 96   Temp 99.3 F (37.4 C) (Oral)   Ht 5\' 9"  (1.753 m)   Wt 205 lb (93 kg)   LMP 03/21/2018   SpO2 98%   BMI 30.27 kg/m    Subjective:    Patient ID: Linda [name], female    DOB: [birth year], 37 y.o.   MRN: [id number]  HPI: Linda [name] is a 37 y.o. female presenting on 04/04/2018 for comprehensive medical examination. Current medical complaints include:  Just started Wellbutrin on Monday- tolerating well.   Menopausal Symptoms: no  Depression Screen done today and results listed below:  Depression screen [facility] 2/9 04/04/2018 03/10/2018  Decreased Interest 1 1  Down, Depressed, Hopeless 3 3  PHQ - 2 Score 4 4  Altered sleeping 0 2  Tired, decreased energy 1 1  Change in appetite 3 2  Feeling bad or failure about yourself  3 2  Trouble concentrating 3 2  Moving slowly or fidgety/restless 0 0  Suicidal thoughts 1 0  PHQ-9 Score 15 13  Difficult doing work/chores Very difficult -    Past Medical History:  Past Medical History:  Diagnosis Date  . Hyperlipidemia   . PCOS (polycystic ovarian syndrome)   . Smoker   . Thyroid disease     Surgical History:  Past Surgical History:  Procedure Laterality Date  . lymph nodes surgery      Medications:  Current Outpatient Medications on File Prior to Visit  Medication Sig  . buPROPion (WELLBUTRIN SR) 150 MG 12 hr tablet 1 tab daily for 1 week, then increase to 1 tab BID  . ciclopirox (PENLAC) 8 % solution Apply over nail and surrounding skin. Apply daily over previous coat. After seven (7) days, may remove with alcohol and continue cycle.  . Multiple Vitamins-Minerals (MULTIVITAMIN PO) Take by mouth daily.  . S-Adenosylmethionine (SAME) 400 MG TABS Take by mouth daily.   No current facility-administered medications on file prior to visit.     Allergies:  No Known Allergies  Social History:  Social History   Socioeconomic History  . Marital status: Single    Spouse name: Not on file  .  Number of children: Not on file  . Years of education: Not on file  . Highest education level: Not on file  Occupational History  . Not on file  Social Needs  . Financial resource strain: Not on file  . Food insecurity:    Worry: Not on file    Inability: Not on file  . Transportation needs:    Medical: Not on file    Non-medical: Not on file  Tobacco Use  . Smoking status: Current Every Day Smoker    Packs/day: 1.00  . Smokeless tobacco: Never Used  Substance and Sexual Activity  . Alcohol use: Yes    Alcohol/week: 2.0 standard drinks    Types: 2 Glasses of wine per week  . Drug use: Never  . Sexual activity: Yes  Lifestyle  . Physical activity:    Days per week: Not on file    Minutes per session: Not on file  . Stress: Not on file  Relationships  . Social connections:    Talks on phone: Not on file    Gets together: Not on file    Attends religious service: Not on file    Active member of club or organization: Not on file    Attends meetings of clubs or organizations: Not on file  Relationship status: Not on file  . Intimate partner violence:    Fear of current or ex partner: Not on file    Emotionally abused: Not on file    Physically abused: Not on file    Forced sexual activity: Not on file  Other Topics Concern  . Not on file  Social History Narrative  . Not on file   Social History   Tobacco Use  Smoking Status Current Every Day Smoker  . Packs/day: 1.00  Smokeless Tobacco Never Used   Social History   Substance and Sexual Activity  Alcohol Use Yes  . Alcohol/week: 2.0 standard drinks  . Types: 2 Glasses of wine per week    Family History:  Family History  Problem Relation Age of Onset  . Dementia Paternal Grandmother     Past medical history, surgical history, medications, allergies, family history and social history reviewed with patient today and changes made to appropriate areas of the chart.   Review of Systems  Constitutional:  Negative.   HENT: Negative.   Eyes: Negative.   Respiratory: Negative.   Cardiovascular: Positive for chest pain and palpitations. Negative for orthopnea, claudication, leg swelling and PND.  Gastrointestinal: Positive for nausea. Negative for abdominal pain, blood in stool, constipation, diarrhea, heartburn, melena and vomiting.  Genitourinary: Negative.   Musculoskeletal: Negative.   Skin: Negative.   Neurological: Negative.   Endo/Heme/Allergies: Negative.   Psychiatric/Behavioral: Positive for depression. Negative for hallucinations, memory loss, substance abuse and suicidal ideas. The patient is not nervous/anxious and does not have insomnia.     All other ROS negative except what is listed above and in the HPI.      Objective:    BP (!) 125/92   Pulse 96   Temp 99.3 F (37.4 C) (Oral)   Ht 5\' 9"  (1.753 m)   Wt 205 lb (93 kg)   LMP 03/21/2018   SpO2 98%   BMI 30.27 kg/m   Wt Readings from Last 3 Encounters:  04/04/18 205 lb (93 kg)  03/10/18 204 lb (92.5 kg)    Physical Exam  Constitutional: She is oriented to person, place, and time. She appears well-developed and well-nourished. No distress.  HENT:  Head: Normocephalic and atraumatic.  Right Ear: Hearing, tympanic membrane, external ear and ear canal normal.  Left Ear: Hearing, tympanic membrane, external ear and ear canal normal.  Nose: Nose normal.  Mouth/Throat: Uvula is midline, oropharynx is clear and moist and mucous membranes are normal. No oropharyngeal exudate.  Eyes: Pupils are equal, round, and reactive to light. Conjunctivae, EOM and lids are normal. Right eye exhibits no discharge. Left eye exhibits no discharge. No scleral icterus.  Neck: Normal range of motion. Neck supple. No JVD present. No tracheal deviation present. No thyromegaly present.  Cardiovascular: Normal rate, regular rhythm, normal heart sounds and intact distal pulses. Exam reveals no gallop and no friction rub.  No murmur  heard. Pulmonary/Chest: Effort normal and breath sounds normal. No stridor. No respiratory distress. She has no wheezes. She has no rales. She exhibits no tenderness. Right breast exhibits no inverted nipple, no mass, no nipple discharge, no skin change and no tenderness. Left breast exhibits no inverted nipple, no mass, no nipple discharge, no skin change and no tenderness. No breast swelling, tenderness, discharge or bleeding. Breasts are symmetrical.  Abdominal: Soft. Bowel sounds are normal. She exhibits no distension and no mass. There is no tenderness. There is no rebound and no guarding. No hernia. Hernia confirmed  negative in the right inguinal area and confirmed negative in the left inguinal area.  Genitourinary: Uterus normal. Rectal exam shows guaiac negative stool. No labial fusion. There is no rash, tenderness, lesion or injury on the right labia. There is no rash, tenderness, lesion or injury on the left labia. Uterus is not deviated, not enlarged, not fixed and not tender. No erythema, tenderness or bleeding in the vagina. No foreign body in the vagina. No signs of injury around the vagina. Vaginal discharge found.  Musculoskeletal: Normal range of motion. She exhibits no edema, tenderness or deformity.  Lymphadenopathy:    She has no cervical adenopathy.  Neurological: She is alert and oriented to person, place, and time. She displays normal reflexes. No cranial nerve deficit or sensory deficit. She exhibits normal muscle tone. Coordination normal.  Skin: Skin is warm, dry and intact. Capillary refill takes less than 2 seconds. No rash noted. She is not diaphoretic. No erythema. No pallor.  Psychiatric: She has a normal mood and affect. Her speech is normal and behavior is normal. Judgment and thought content normal. Cognition and memory are normal.  Nursing note and vitals reviewed. Pelvic and Breast exam done today with Sheilah Mins, CMA in attendance.   Results for orders placed or  performed in visit on 03/10/18  Microscopic Examination  Result Value Ref Range   WBC, UA None seen 0 - 5 /hpf   RBC, UA 0-2 0 - 2 /hpf   Epithelial Cells (non renal) 0-10 0 - 10 /hpf   Bacteria, UA Few None seen/Few  CBC with Differential/Platelet  Result Value Ref Range   WBC 11.2 (H) 3.4 - 10.8 x10E3/uL   RBC 5.00 3.77 - 5.28 x10E6/uL   Hemoglobin 15.8 11.1 - 15.9 g/dL   Hematocrit 14.7 (H) 82.9 - 46.6 %   MCV 94 79 - 97 fL   MCH 31.6 26.6 - 33.0 pg   MCHC 33.6 31.5 - 35.7 g/dL   RDW 56.2 13.0 - 86.5 %   Platelets 261 150 - 450 x10E3/uL   Neutrophils 59 Not Estab. %   Lymphs 30 Not Estab. %   Monocytes 7 Not Estab. %   Eos 3 Not Estab. %   Basos 0 Not Estab. %   Neutrophils Absolute 6.6 1.4 - 7.0 x10E3/uL   Lymphocytes Absolute 3.3 (H) 0.7 - 3.1 x10E3/uL   Monocytes Absolute 0.8 0.1 - 0.9 x10E3/uL   EOS (ABSOLUTE) 0.3 0.0 - 0.4 x10E3/uL   Basophils Absolute 0.1 0.0 - 0.2 x10E3/uL   Immature Granulocytes 1 Not Estab. %   Immature Grans (Abs) 0.1 0.0 - 0.1 x10E3/uL  Comprehensive metabolic panel  Result Value Ref Range   Glucose 83 65 - 99 mg/dL   BUN 10 6 - 20 mg/dL   Creatinine, Ser 7.84 0.57 - 1.00 mg/dL   GFR calc non Af Amer 99 >59 mL/min/1.73   GFR calc Af Amer 114 >59 mL/min/1.73   BUN/Creatinine Ratio 13 9 - 23   Sodium 141 134 - 144 mmol/L   Potassium 4.5 3.5 - 5.2 mmol/L   Chloride 105 96 - 106 mmol/L   CO2 16 (L) 20 - 29 mmol/L   Calcium 9.5 8.7 - 10.2 mg/dL   Total Protein 6.8 6.0 - 8.5 g/dL   Albumin 4.4 3.5 - 5.5 g/dL   Globulin, Total 2.4 1.5 - 4.5 g/dL   Albumin/Globulin Ratio 1.8 1.2 - 2.2   Bilirubin Total 0.3 0.0 - 1.2 mg/dL   Alkaline Phosphatase 67  39 - 117 IU/L   AST 16 0 - 40 IU/L   ALT 19 0 - 32 IU/L  Lipid Panel w/o Chol/HDL Ratio  Result Value Ref Range   Cholesterol, Total 201 (H) 100 - 199 mg/dL   Triglycerides 119 (H) 0 - 149 mg/dL   HDL 47 >14 mg/dL   VLDL Cholesterol Cal 52 (H) 5 - 40 mg/dL   LDL Calculated 782 (H) 0 - 99 mg/dL   TSH  Result Value Ref Range   TSH 0.664 0.450 - 4.500 uIU/mL  UA/M w/rflx Culture, Routine  Result Value Ref Range   Specific Gravity, UA 1.020 1.005 - 1.030   pH, UA 5.0 5.0 - 7.5   Color, UA Orange Yellow   Appearance Ur Hazy (A) Clear   Leukocytes, UA Negative Negative   Protein, UA Negative Negative/Trace   Glucose, UA Negative Negative   Ketones, UA Negative Negative   RBC, UA 1+ (A) Negative   Bilirubin, UA Negative Negative   Urobilinogen, Ur 0.2 0.2 - 1.0 mg/dL   Nitrite, UA Negative Negative   Microscopic Examination See below:   VITAMIN D 25 Hydroxy (Vit-D Deficiency, Fractures)  Result Value Ref Range   Vit D, 25-Hydroxy 28.0 (L) 30.0 - 100.0 ng/mL      Assessment & Plan:   Problem List Items Addressed This Visit    None    Visit Diagnoses    Routine general medical examination at a health care facility    -  Primary   Vaccines up to date. Screening labs up to date. Pap done today. Continue diet and exercise. Call with any concerns.    Screening for cervical cancer       Pap done today.   Relevant Orders   Cytology - PAP   Vaginal discharge       Checking wet prep.   Relevant Orders   WET PREP FOR TRICH, YEAST, CLUE       Follow up plan: Return 3-4 weeks, for follow up mood.   LABORATORY TESTING:  - Pap smear: pap done  IMMUNIZATIONS:   - Tdap: Tetanus vaccination status reviewed: last tetanus booster within 10 years. - Influenza: Up to date - Pneumovax: Refused  PATIENT COUNSELING:   Advised to take 1 mg of folate supplement per day if capable of pregnancy.   Sexuality: Discussed sexually transmitted diseases, partner selection, use of condoms, avoidance of unintended pregnancy  and contraceptive alternatives.   Advised to avoid cigarette smoking.  I discussed with the patient that most people either abstain from alcohol or drink within safe limits (<=14/week and <=4 drinks/occasion for males, <=7/weeks and <= 3 drinks/occasion for females)  and that the risk for alcohol disorders and other health effects rises proportionally with the number of drinks per week and how often a drinker exceeds daily limits.  Discussed cessation/primary prevention of drug use and availability of treatment for abuse.   Diet: Encouraged to adjust caloric intake to maintain  or achieve ideal body weight, to reduce intake of dietary saturated fat and total fat, to limit sodium intake by avoiding high sodium foods and not adding table salt, and to maintain adequate dietary potassium and calcium preferably from fresh fruits, vegetables, and low-fat dairy products.    stressed the importance of regular exercise  Injury prevention: Discussed safety belts, safety helmets, smoke detector, smoking near bedding or upholstery.   Dental health: Discussed importance of regular tooth brushing, flossing, and dental visits.    NEXT PREVENTATIVE  PHYSICAL DUE IN 1 YEAR. Return 3-4 weeks, for follow up mood.

## 2018-04-04 NOTE — Patient Instructions (Signed)
Health Maintenance, Female Adopting a healthy lifestyle and getting preventive care can go a long way to promote health and wellness. Talk with your health care provider about what schedule of regular examinations is right for you. This is a good chance for you to check in with your provider about disease prevention and staying healthy. In between checkups, there are plenty of things you can do on your own. Experts have done a lot of research about which lifestyle changes and preventive measures are most likely to keep you healthy. Ask your health care provider for more information. Weight and diet Eat a healthy diet  Be sure to include plenty of vegetables, fruits, low-fat dairy products, and lean protein.  Do not eat a lot of foods high in solid fats, added sugars, or salt.  Get regular exercise. This is one of the most important things you can do for your health. ? Most adults should exercise for at least 150 minutes each week. The exercise should increase your heart rate and make you sweat (moderate-intensity exercise). ? Most adults should also do strengthening exercises at least twice a week. This is in addition to the moderate-intensity exercise.  Maintain a healthy weight  Body mass index (BMI) is a measurement that can be used to identify possible weight problems. It estimates body fat based on height and weight. Your health care provider can help determine your BMI and help you achieve or maintain a healthy weight.  For females 20 years of age and older: ? A BMI below 18.5 is considered underweight. ? A BMI of 18.5 to 24.9 is normal. ? A BMI of 25 to 29.9 is considered overweight. ? A BMI of 30 and above is considered obese.  Watch levels of cholesterol and blood lipids  You should start having your blood tested for lipids and cholesterol at 37 years of age, then have this test every 5 years.  You may need to have your cholesterol levels checked more often if: ? Your lipid or  cholesterol levels are high. ? You are older than 37 years of age. ? You are at high risk for heart disease.  Cancer screening Lung Cancer  Lung cancer screening is recommended for adults 55-80 years old who are at high risk for lung cancer because of a history of smoking.  A yearly low-dose CT scan of the lungs is recommended for people who: ? Currently smoke. ? Have quit within the past 15 years. ? Have at least a 30-pack-year history of smoking. A pack year is smoking an average of one pack of cigarettes a day for 1 year.  Yearly screening should continue until it has been 15 years since you quit.  Yearly screening should stop if you develop a health problem that would prevent you from having lung cancer treatment.  Breast Cancer  Practice breast self-awareness. This means understanding how your breasts normally appear and feel.  It also means doing regular breast self-exams. Let your health care provider know about any changes, no matter how small.  If you are in your 20s or 30s, you should have a clinical breast exam (CBE) by a health care provider every 1-3 years as part of a regular health exam.  If you are 40 or older, have a CBE every year. Also consider having a breast X-ray (mammogram) every year.  If you have a family history of breast cancer, talk to your health care provider about genetic screening.  If you are at high risk   for breast cancer, talk to your health care provider about having an MRI and a mammogram every year.  Breast cancer gene (BRCA) assessment is recommended for women who have family members with BRCA-related cancers. BRCA-related cancers include: ? Breast. ? Ovarian. ? Tubal. ? Peritoneal cancers.  Results of the assessment will determine the need for genetic counseling and BRCA1 and BRCA2 testing.  Cervical Cancer Your health care provider may recommend that you be screened regularly for cancer of the pelvic organs (ovaries, uterus, and  vagina). This screening involves a pelvic examination, including checking for microscopic changes to the surface of your cervix (Pap test). You may be encouraged to have this screening done every 3 years, beginning at age 22.  For women ages 56-65, health care providers may recommend pelvic exams and Pap testing every 3 years, or they may recommend the Pap and pelvic exam, combined with testing for human papilloma virus (HPV), every 5 years. Some types of HPV increase your risk of cervical cancer. Testing for HPV may also be done on women of any age with unclear Pap test results.  Other health care providers may not recommend any screening for nonpregnant women who are considered low risk for pelvic cancer and who do not have symptoms. Ask your health care provider if a screening pelvic exam is right for you.  If you have had past treatment for cervical cancer or a condition that could lead to cancer, you need Pap tests and screening for cancer for at least 20 years after your treatment. If Pap tests have been discontinued, your risk factors (such as having a new sexual partner) need to be reassessed to determine if screening should resume. Some women have medical problems that increase the chance of getting cervical cancer. In these cases, your health care provider may recommend more frequent screening and Pap tests.  Colorectal Cancer  This type of cancer can be detected and often prevented.  Routine colorectal cancer screening usually begins at 37 years of age and continues through 37 years of age.  Your health care provider may recommend screening at an earlier age if you have risk factors for colon cancer.  Your health care provider may also recommend using home test kits to check for hidden blood in the stool.  A small camera at the end of a tube can be used to examine your colon directly (sigmoidoscopy or colonoscopy). This is done to check for the earliest forms of colorectal  cancer.  Routine screening usually begins at age 33.  Direct examination of the colon should be repeated every 5-10 years through 37 years of age. However, you may need to be screened more often if early forms of precancerous polyps or small growths are found.  Skin Cancer  Check your skin from head to toe regularly.  Tell your health care provider about any new moles or changes in moles, especially if there is a change in a mole's shape or color.  Also tell your health care provider if you have a mole that is larger than the size of a pencil eraser.  Always use sunscreen. Apply sunscreen liberally and repeatedly throughout the day.  Protect yourself by wearing long sleeves, pants, a wide-brimmed hat, and sunglasses whenever you are outside.  Heart disease, diabetes, and high blood pressure  High blood pressure causes heart disease and increases the risk of stroke. High blood pressure is more likely to develop in: ? People who have blood pressure in the high end of  the normal range (130-139/85-89 mm Hg). ? People who are overweight or obese. ? People who are African American.  If you are 21-29 years of age, have your blood pressure checked every 3-5 years. If you are 3 years of age or older, have your blood pressure checked every year. You should have your blood pressure measured twice-once when you are at a hospital or clinic, and once when you are not at a hospital or clinic. Record the average of the two measurements. To check your blood pressure when you are not at a hospital or clinic, you can use: ? An automated blood pressure machine at a pharmacy. ? A home blood pressure monitor.  If you are between 17 years and 37 years old, ask your health care provider if you should take aspirin to prevent strokes.  Have regular diabetes screenings. This involves taking a blood sample to check your fasting blood sugar level. ? If you are at a normal weight and have a low risk for diabetes,  have this test once every three years after 37 years of age. ? If you are overweight and have a high risk for diabetes, consider being tested at a younger age or more often. Preventing infection Hepatitis B  If you have a higher risk for hepatitis B, you should be screened for this virus. You are considered at high risk for hepatitis B if: ? You were born in a country where hepatitis B is common. Ask your health care provider which countries are considered high risk. ? Your parents were born in a high-risk country, and you have not been immunized against hepatitis B (hepatitis B vaccine). ? You have HIV or AIDS. ? You use needles to inject street drugs. ? You live with someone who has hepatitis B. ? You have had sex with someone who has hepatitis B. ? You get hemodialysis treatment. ? You take certain medicines for conditions, including cancer, organ transplantation, and autoimmune conditions.  Hepatitis C  Blood testing is recommended for: ? Everyone born from [birth year] through 1965. ? Anyone with known risk factors for hepatitis C.  Sexually transmitted infections (STIs)  You should be screened for sexually transmitted infections (STIs) including gonorrhea and chlamydia if: ? You are sexually active and are younger than 37 years of age. ? You are older than 36 years of age and your health care provider tells you that you are at risk for this type of infection. ? Your sexual activity has changed since you were last screened and you are at an increased risk for chlamydia or gonorrhea. Ask your health care provider if you are at risk.  If you do not have HIV, but are at risk, it may be recommended that you take a prescription medicine daily to prevent HIV infection. This is called pre-exposure prophylaxis (PrEP). You are considered at risk if: ? You are sexually active and do not regularly use condoms or know the HIV status of your partner(s). ? You take drugs by injection. ? You are  sexually active with a partner who has HIV.  Talk with your health care provider about whether you are at high risk of being infected with HIV. If you choose to begin PrEP, you should first be tested for HIV. You should then be tested every 3 months for as long as you are taking PrEP. Pregnancy  If you are premenopausal and you may become pregnant, ask your health care provider about preconception counseling.  If you may become  pregnant, take 400 to 800 micrograms (mcg) of folic acid every day.  If you want to prevent pregnancy, talk to your health care provider about birth control (contraception). Osteoporosis and menopause  Osteoporosis is a disease in which the bones lose minerals and strength with aging. This can result in serious bone fractures. Your risk for osteoporosis can be identified using a bone density scan.  If you are 100 years of age or older, or if you are at risk for osteoporosis and fractures, ask your health care provider if you should be screened.  Ask your health care provider whether you should take a calcium or vitamin D supplement to lower your risk for osteoporosis.  Menopause may have certain physical symptoms and risks.  Hormone replacement therapy may reduce some of these symptoms and risks. Talk to your health care provider about whether hormone replacement therapy is right for you. Follow these instructions at home:  Schedule regular health, dental, and eye exams.  Stay current with your immunizations.  Do not use any tobacco products including cigarettes, chewing tobacco, or electronic cigarettes.  If you are pregnant, do not drink alcohol.  If you are breastfeeding, limit how much and how often you drink alcohol.  Limit alcohol intake to no more than 1 drink per day for nonpregnant women. One drink equals 12 ounces of beer, 5 ounces of wine, or 1 ounces of hard liquor.  Do not use street drugs.  Do not share needles.  Ask your health care  provider for help if you need support or information about quitting drugs.  Tell your health care provider if you often feel depressed.  Tell your health care provider if you have ever been abused or do not feel safe at home. This information is not intended to replace advice given to you by your health care provider. Make sure you discuss any questions you have with your health care provider. Document Released: 12/18/2010 Document Revised: 11/10/2015 Document Reviewed: 03/08/2015 Elsevier Interactive Patient Education  2018 [employer]. Pneumococcal Polysaccharide Vaccine: What You Need to Know 1. Why get vaccinated? Vaccination can protect older adults (and some children and younger adults) from pneumococcal disease. Pneumococcal disease is caused by bacteria that can spread from person to person through close contact. It can cause ear infections, and it can also lead to more serious infections of the:  Lungs (pneumonia),  Blood (bacteremia), and  Covering of the brain and spinal cord (meningitis). Meningitis can cause deafness and brain damage, and it can be fatal.  Anyone can get pneumococcal disease, but children under 61 years of age, people with certain medical conditions, adults over 91 years of age, and cigarette smokers are at the highest risk. About 18,000 older adults die each year from pneumococcal disease in the [place]. Treatment of pneumococcal infections with penicillin and other drugs used to be more effective. But some strains of the disease have become resistant to these drugs. This makes prevention of the disease, through vaccination, even more important. 2. Pneumococcal polysaccharide vaccine (PPSV23) Pneumococcal polysaccharide vaccine (PPSV23) protects against 23 types of pneumococcal bacteria. It will not prevent all pneumococcal disease. PPSV23 is recommended for:  All adults 60 years of age and older,  Anyone 2 through 37 years of age with certain  long-term health problems,  Anyone 2 through 37 years of age with a weakened immune system,  Adults 109 through 37 years of age who smoke cigarettes or have asthma.  Most people need only one dose  of PPSV. A second dose is recommended for certain high-risk groups. People 39 and older should get a dose even if they have gotten one or more doses of the vaccine before they turned 65. Your healthcare provider can give you more information about these recommendations. Most healthy adults develop protection within 2 to 3 weeks of getting the shot. 3. Some people should not get this vaccine  Anyone who has had a life-threatening allergic reaction to PPSV should not get another dose.  Anyone who has a severe allergy to any component of PPSV should not receive it. Tell your provider if you have any severe allergies.  Anyone who is moderately or severely ill when the shot is scheduled may be asked to wait until they recover before getting the vaccine. Someone with a mild illness can usually be vaccinated.  Children less than 26 years of age should not receive this vaccine.  There is no evidence that PPSV is harmful to either a pregnant woman or to her fetus. However, as a precaution, women who need the vaccine should be vaccinated before becoming pregnant, if possible. 4. Risks of a vaccine reaction With any medicine, including vaccines, there is a chance of side effects. These are usually mild and go away on their own, but serious reactions are also possible. About half of people who get PPSV have mild side effects, such as redness or pain where the shot is given, which go away within about two days. Less than 1 out of 100 people develop a fever, muscle aches, or more severe local reactions. Problems that could happen after any vaccine:  People sometimes faint after a medical procedure, including vaccination. Sitting or lying down for about 15 minutes can help prevent fainting, and injuries caused by  a fall. Tell your doctor if you feel dizzy, or have vision changes or ringing in the ears.  Some people get severe pain in the shoulder and have difficulty moving the arm where a shot was given. This happens very rarely.  Any medication can cause a severe allergic reaction. Such reactions from a vaccine are very rare, estimated at about 1 in a million doses, and would happen within a few minutes to a few hours after the vaccination. As with any medicine, there is a very remote chance of a vaccine causing a serious injury or death. The safety of vaccines is always being monitored. For more information, visit: http://www.aguilar.org/ 5. What if there is a serious reaction? What should I look for? Look for anything that concerns you, such as signs of a severe allergic reaction, very high fever, or unusual behavior. Signs of a severe allergic reaction can include hives, swelling of the face and throat, difficulty breathing, a fast heartbeat, dizziness, and weakness. These would usually start a few minutes to a few hours after the vaccination. What should I do? If you think it is a severe allergic reaction or other emergency that can't wait, call 9-1-1 or get to the nearest hospital. Otherwise, call your doctor. Afterward, the reaction should be reported to the Vaccine Adverse Event Reporting System (VAERS). Your doctor might file this report, or you can do it yourself through the VAERS web site at www.vaers.SamedayNews.es, or by calling 862-732-0376. VAERS does not give medical advice. 6. How can I learn more?  Ask your doctor. He or she can give you the vaccine package insert or suggest other sources of information.  Call your local or state health department.  Contact the Centers for  Disease Control and Prevention (CDC): ? Call 647-105-6337 (1-800-CDC-INFO) or ? Visit CDC's website at http://hunter.com/ CDC Pneumococcal Polysaccharide Vaccine VIS (10/09/13) This information is not intended to  replace advice given to you by your health care provider. Make sure you discuss any questions you have with your health care provider. Document Released: 04/01/2006 Document Revised: 02/23/2016 Document Reviewed: 02/23/2016 Elsevier Interactive Patient Education  2017 [employer].

## 2018-04-09 LAB — CYTOLOGY - PAP
Adequacy: ABSENT
Diagnosis: NEGATIVE
HPV: NOT DETECTED

## 2018-05-08 ENCOUNTER — Encounter: Payer: Self-pay | Admitting: Family Medicine

## 2018-05-08 ENCOUNTER — Ambulatory Visit: Payer: BLUE CROSS/BLUE SHIELD | Admitting: Family Medicine

## 2018-05-08 ENCOUNTER — Other Ambulatory Visit: Payer: Self-pay

## 2018-05-08 VITALS — BP 133/87 | HR 92 | Temp 98.7°F | Ht 69.0 in | Wt 201.0 lb

## 2018-05-08 DIAGNOSIS — F339 Major depressive disorder, recurrent, unspecified: Secondary | ICD-10-CM

## 2018-05-08 MED ORDER — HYDROXYZINE HCL 25 MG PO TABS: 25.0000 mg | ORAL_TABLET | Freq: Three times a day (TID) | ORAL | 3 refills | Status: DC | PRN

## 2018-05-08 MED ORDER — LORAZEPAM 0.5 MG PO TABS: 0.5000 mg | ORAL_TABLET | Freq: Two times a day (BID) | ORAL | 0 refills | Status: DC | PRN

## 2018-05-08 MED ORDER — BUPROPION HCL ER (XL) 300 MG PO TB24: 300.0000 mg | ORAL_TABLET | Freq: Every day | ORAL | 3 refills | Status: DC

## 2018-05-08 MED ORDER — FLUOXETINE HCL 90 MG PO CPDR: 90.0000 mg | DELAYED_RELEASE_CAPSULE | ORAL | 3 refills | Status: DC

## 2018-05-08 NOTE — Progress Notes (Signed)
BP 133/87   Pulse 92   Temp 98.7 F (37.1 C) (Oral)   Ht 5\' 9"  (1.753 m)   Wt 201 lb (91.2 kg)   SpO2 99%   BMI 29.68 kg/m    Subjective:    Patient ID: Linda [name], female    DOB: [birth year], 37 y.o.   MRN: [id number]  HPI: Linda [name] is a 37 y.o. female  Chief Complaint  Patient presents with  . Depression    4w f/u   DEPRESSION- feels like some of the symptoms of anxiety has gotten better, but has been having worse anxiety. Having panic attacks Mood status: A little better, but not great, other symptoms worse, significantly worse around the time of her period Satisfied with current treatment?: no Symptom severity: severe  Duration of current treatment : weeks Side effects: no Medication compliance: excellent compliance Psychotherapy/counseling: yes current Previous psychiatric medications: wellbutrin Depressed mood: yes Anxious mood: yes Anhedonia: no Significant weight loss or gain: no Insomnia: yes hard to fall asleep Fatigue: yes Feelings of worthlessness or guilt: yes Impaired concentration/indecisiveness: yes Suicidal ideations: no Hopelessness: yes Crying spells: yes [facility] 04/04/2018 03/10/2018  Decreased Interest 1 1  Down, Depressed, Hopeless 3 3  PHQ - 2 Score 4 4  Altered sleeping 0 2  Tired, decreased energy 1 1  Change in appetite 3 2  Feeling bad or failure about yourself  3 2  Trouble concentrating 3 2  Moving slowly or fidgety/restless 0 0  Suicidal thoughts 1 0  PHQ-9 Score 15 13  Difficult doing work/chores Very difficult -   GAD 7 : Generalized Anxiety Score 04/04/2018 03/10/2018  Nervous, Anxious, on Edge 2 1  Control/stop worrying 3 2  Worry too much - different things 3 2  Trouble relaxing 3 1  Restless 0 0  Easily annoyed or irritable 1 1  Afraid - awful might happen 0 0  Total GAD 7 Score 12 7  Anxiety Difficulty Somewhat difficult Somewhat difficult    Relevant past medical, surgical, family and  social history reviewed and updated as indicated. Interim medical history since our last visit reviewed. Allergies and medications reviewed and updated.  Review of Systems  Constitutional: Negative.   Respiratory: Negative.   Cardiovascular: Negative.   Skin: Negative.   Neurological: Negative.   Psychiatric/Behavioral: Positive for dysphoric mood and sleep disturbance. Negative for agitation, confusion, decreased concentration, hallucinations, self-injury and suicidal ideas. The patient is nervous/anxious. The patient is not hyperactive.     Per HPI unless specifically indicated above     Objective:    BP 133/87   Pulse 92   Temp 98.7 F (37.1 C) (Oral)   Ht 5\' 9"  (1.753 m)   Wt 201 lb (91.2 kg)   SpO2 99%   BMI 29.68 kg/m   Wt Readings from Last 3 Encounters:  05/08/18 201 lb (91.2 kg)  04/04/18 205 lb (93 kg)  03/10/18 204 lb (92.5 kg)    Physical Exam  Constitutional: She is oriented to person, place, and time. She appears well-developed and well-nourished. No distress.  HENT:  Head: Normocephalic and atraumatic.  Right Ear: Hearing normal.  Left Ear: Hearing normal.  Nose: Nose normal.  Eyes: Conjunctivae and lids are normal. Right eye exhibits no discharge. Left eye exhibits no discharge. No scleral icterus.  Cardiovascular: Normal rate, regular rhythm, normal heart sounds and intact distal pulses. Exam reveals no gallop and no friction rub.  No murmur heard. Pulmonary/Chest:  Effort normal and breath sounds normal. No stridor. No respiratory distress. She has no wheezes. She has no rales. She exhibits no tenderness.  Musculoskeletal: Normal range of motion.  Neurological: She is alert and oriented to person, place, and time.  Skin: Skin is warm, dry and intact. Capillary refill takes less than 2 seconds. No rash noted. She is not diaphoretic. No erythema. No pallor.  Psychiatric: She has a normal mood and affect. Her speech is normal and behavior is normal. Judgment  and thought content normal. Cognition and memory are normal.  Nursing note and vitals reviewed.   Results for orders placed or performed in visit on 04/04/18  WET PREP FOR TRICH, YEAST, CLUE  Result Value Ref Range   Trichomonas Exam Negative Negative   Yeast Exam Negative Negative   Clue Cell Exam Positive (A) Negative  Cytology - PAP  Result Value Ref Range   Adequacy      Satisfactory for evaluation  endocervical/transformation zone component ABSENT.   Diagnosis      NEGATIVE FOR INTRAEPITHELIAL LESIONS OR MALIGNANCY.   HPV NOT DETECTED    Material Submitted CervicoVaginal Pap [ThinPrep Imaged]    CYTOLOGY - PAP PAP RESULT       Assessment & Plan:   Problem List Items Addressed This Visit      Other   Depression, recurrent (HCC) - Primary    Not doing great. Having a lot of panic attacks. Will increase wellbutrin to 300mg  and change to XL- will add weekly prozac for PMDD and treat anxiety with hydroxyzine with PRN ativan for severe panic attacks. Recheck 3-4 weeks. Call with any concerns.       Relevant Medications   buPROPion (WELLBUTRIN XL) 300 MG 24 hr tablet   hydrOXYzine (ATARAX/VISTARIL) 25 MG tablet   LORazepam (ATIVAN) 0.5 MG tablet   FLUoxetine (PROZAC WEEKLY) 90 MG DR capsule       Follow up plan: Return 3-4 weeks, for follow up mood.

## 2018-05-08 NOTE — Assessment & Plan Note (Addendum)
Not doing great. Having a lot of panic attacks. Will increase wellbutrin to 300mg  and change to XL- will add weekly prozac for PMDD and treat anxiety with hydroxyzine with PRN ativan for severe panic attacks. Recheck 3-4 weeks. Call with any concerns.

## 2018-05-30 ENCOUNTER — Encounter: Payer: Self-pay | Admitting: Family Medicine

## 2018-05-30 MED ORDER — FLUOXETINE HCL 40 MG PO CAPS: 80.0000 mg | ORAL_CAPSULE | ORAL | 3 refills | Status: DC

## 2018-06-16 ENCOUNTER — Ambulatory Visit: Payer: BLUE CROSS/BLUE SHIELD | Admitting: Family Medicine

## 2018-06-16 ENCOUNTER — Encounter: Payer: Self-pay | Admitting: Family Medicine

## 2018-06-16 VITALS — BP 129/93 | HR 94 | Temp 99.3°F | Wt 205.2 lb

## 2018-06-16 DIAGNOSIS — F339 Major depressive disorder, recurrent, unspecified: Secondary | ICD-10-CM | POA: Diagnosis not present
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
## 2018-06-30 ENCOUNTER — Encounter: Payer: Self-pay | Admitting: Family Medicine

## 2018-07-02 MED ORDER — FLUOXETINE HCL 40 MG PO CAPS: 80.0000 mg | ORAL_CAPSULE | ORAL | 3 refills | Status: DC

## 2018-09-16 ENCOUNTER — Other Ambulatory Visit: Payer: Self-pay | Admitting: Family Medicine

## 2018-11-23 ENCOUNTER — Other Ambulatory Visit: Payer: Self-pay | Admitting: Family Medicine

## 2018-11-23 NOTE — Telephone Encounter (Signed)
Requested Prescriptions  Pending Prescriptions Disp Refills  . buPROPion (WELLBUTRIN XL) 300 MG 24 hr tablet [Pharmacy Med Name: buPROPion HCl ER (XL) 300 MG Oral Tablet Extended Release 24 Hour] 30 tablet 0    Sig: Take 1 tablet by mouth once daily     Psychiatry: Antidepressants - bupropion Failed - 11/23/2018  8:21 AM      Failed - Last BP in normal range    BP Readings from Last 1 Encounters:  06/16/18 (!) 129/93         Passed - Valid encounter within last 6 months    Recent Outpatient Visits          5 months ago Depression, recurrent ([place])   [place], Megan P, DO   6 months ago Depression, recurrent ([place])   [place], Megan P, DO   7 months ago Routine general medical examination at a health care facility   [facility], [place], DO   8 months ago Depression, recurrent [facility])   [place], Megan P, DO             Passed - Completed PHQ-2 or PHQ-9 in the last 360 days.

## 2018-12-01 ENCOUNTER — Other Ambulatory Visit: Payer: Self-pay | Admitting: Family Medicine

## 2018-12-29 ENCOUNTER — Other Ambulatory Visit: Payer: Self-pay | Admitting: Family Medicine

## 2018-12-29 NOTE — Telephone Encounter (Signed)
Pt was already given #30

## 2018-12-31 ENCOUNTER — Other Ambulatory Visit: Payer: Self-pay | Admitting: Family Medicine

## 2018-12-31 ENCOUNTER — Encounter: Payer: Self-pay | Admitting: Family Medicine

## 2018-12-31 MED ORDER — FLUOXETINE HCL 40 MG PO CAPS: 40.0000 mg | ORAL_CAPSULE | Freq: Every day | ORAL | 0 refills | Status: DC

## 2018-12-31 MED ORDER — BUPROPION HCL ER (XL) 300 MG PO TB24: 300.0000 mg | ORAL_TABLET | Freq: Every day | ORAL | 0 refills | Status: DC

## 2018-12-31 NOTE — Telephone Encounter (Signed)
Pt scheduled for Friday. Pt is out of medication would like to know if some can be sent in until her OV.

## 2018-12-31 NOTE — Telephone Encounter (Signed)
Routing to provider  

## 2018-12-31 NOTE — Telephone Encounter (Signed)
Needs follow up appointment.  

## 2019-01-02 ENCOUNTER — Other Ambulatory Visit: Payer: Self-pay

## 2019-01-02 ENCOUNTER — Ambulatory Visit (INDEPENDENT_AMBULATORY_CARE_PROVIDER_SITE_OTHER): Payer: BC Managed Care – PPO | Admitting: Family Medicine

## 2019-01-02 DIAGNOSIS — F339 Major depressive disorder, recurrent, unspecified: Secondary | ICD-10-CM

## 2019-01-02 DIAGNOSIS — F3281 Premenstrual dysphoric disorder: Secondary | ICD-10-CM

## 2019-01-02 MED ORDER — NORETHIN ACE-ETH ESTRAD-FE 1-20 MG-MCG PO TABS: 1.0000 | ORAL_TABLET | Freq: Every day | ORAL | 11 refills | Status: DC

## 2019-01-02 NOTE — Progress Notes (Signed)
There were no vitals taken for this visit.   Subjective:    Patient ID: Linda [name], female    DOB: February 19, 1981, 38 y.o.   MRN: [id number]  HPI: Linda Makervril C Dinkel is a 38 y.o. female  Chief Complaint  Patient presents with  . Depression   DEPRESSION- feeling a lot better Mood status: better Satisfied with current treatment?: yes Symptom severity: mild  Duration of current treatment : chronic Side effects: no Medication compliance: excellent compliance Psychotherapy/counseling: no  Previous psychiatric medications:  Depressed mood: yes Anxious mood: yes Anhedonia: no Significant weight loss or gain: no Insomnia: no  Fatigue: yes Feelings of worthlessness or guilt: yes Impaired concentration/indecisiveness: yes Suicidal ideations: no Hopelessness: no Crying spells: no Depression screen [facility] 01/02/2019 06/16/2018 04/04/2018 03/10/2018  Decreased Interest 1 2 1 1   Down, Depressed, Hopeless 3 3 3 3   PHQ - 2 Score 4 5 4 4   Altered sleeping 3 1 0 2  Tired, decreased energy 3 2 1 1   Change in appetite 1 2 3 2   Feeling bad or failure about yourself  2 3 3 2   Trouble concentrating 3 1 3 2   Moving slowly or fidgety/restless 1 0 0 0  Suicidal thoughts 0 1 1 0  PHQ-9 Score 17 15 15 13   Difficult doing work/chores Somewhat difficult Somewhat difficult Very difficult -   GAD 7 : Generalized Anxiety Score 01/02/2019 06/16/2018 04/04/2018 03/10/2018  Nervous, Anxious, on Edge 1 2 2 1   Control/stop worrying 1 3 3 2   Worry too much - different things 1 2 3 2   Trouble relaxing 1 1 3 1   Restless 1 0 0 0  Easily annoyed or irritable 0 2 1 1   Afraid - awful might happen 1 0 0 0  Total GAD 7 Score 6 10 12 7   Anxiety Difficulty Somewhat difficult Somewhat difficult Somewhat difficult Somewhat difficult    Relevant past medical, surgical, family and social history reviewed and updated as indicated. Interim medical history since our last visit reviewed. Allergies and medications  reviewed and updated.  Review of Systems  Constitutional: Negative.   Respiratory: Negative.   Cardiovascular: Negative.   Musculoskeletal: Negative.   Skin: Negative.   Neurological: Negative.   Psychiatric/Behavioral: Positive for dysphoric mood. Negative for agitation, behavioral problems, confusion, decreased concentration, hallucinations, self-injury, sleep disturbance and suicidal ideas. The patient is nervous/anxious. The patient is not hyperactive.     Per HPI unless specifically indicated above     Objective:    There were no vitals taken for this visit.  Wt Readings from Last 3 Encounters:  06/16/18 205 lb 3.2 oz (93.1 kg)  05/08/18 201 lb (91.2 kg)  04/04/18 205 lb (93 kg)    Physical Exam Vitals signs and nursing note reviewed.  Constitutional:      General: She is not in acute distress.    Appearance: Normal appearance. She is not ill-appearing, toxic-appearing or diaphoretic.  HENT:     Head: Normocephalic and atraumatic.     Right Ear: External ear normal.     Left Ear: External ear normal.     Nose: Nose normal.     Mouth/Throat:     Mouth: Mucous membranes are moist.     Pharynx: Oropharynx is clear.  Eyes:     General: No scleral icterus.       Right eye: No discharge.        Left eye: No discharge.     Conjunctiva/sclera: Conjunctivae  normal.     Pupils: Pupils are equal, round, and reactive to light.  Neck:     Musculoskeletal: Normal range of motion.  Pulmonary:     Effort: Pulmonary effort is normal. No respiratory distress.     Comments: Speaking in full sentences Musculoskeletal: Normal range of motion.  Skin:    Coloration: Skin is not jaundiced or pale.     Findings: No bruising, erythema, lesion or rash.  Neurological:     Mental Status: She is alert and oriented to person, place, and time. Mental status is at baseline.  Psychiatric:        Mood and Affect: Mood normal.        Behavior: Behavior normal.        Thought Content: Thought  content normal.        Judgment: Judgment normal.     Results for orders placed or performed in visit on 04/04/18  WET PREP FOR [place], YEAST, CLUE   Specimen: Vaginal Fluid   VAGINAL FLUI  Result Value Ref Range   Trichomonas Exam Negative Negative   Yeast Exam Negative Negative   Clue Cell Exam Positive (A) Negative  Cytology - PAP  Result Value Ref Range   Adequacy      Satisfactory for evaluation  endocervical/transformation zone component ABSENT.   Diagnosis      NEGATIVE FOR INTRAEPITHELIAL LESIONS OR MALIGNANCY.   HPV NOT DETECTED    Material Submitted CervicoVaginal Pap [ThinPrep Imaged]    CYTOLOGY - PAP PAP RESULT       Assessment & Plan:   Problem List Items Addressed This Visit      Other   Depression, recurrent ([place])    Under good control on current regimen. Continue current regimen. Continue to monitor. Call with any concerns. Refills given.        PMDD (premenstrual dysphoric disorder)    Would like to start OCP to see if this might help. Will start monophasic OCP. Call with any concerns. Call with any concerns. Recheck 1 month.           Follow up plan: Return in about 4 weeks (around 01/30/2019) for follow up OCP.    Marland Kitchen This visit was completed via D[employer]imity due to the restrictions of the COVID-19 pandemic. All issues as above were discussed and addressed. Physical exam was done as above through visual confirmation on Doximity. If it was felt that the patient should be evaluated in the office, they were directed there. The patient verbally consented to this visit. . Location of the patient: home . Location of the provider: work . Those involved with this call:  . Provider: Park Liter, DO . CMA: Yvonna Alanis, Bertie . Front Desk/Registration: Don Perking  . Time spent on call: 15 minutes with patient face to face via video conference. More than 50% of this time was spent in counseling and coordination of care. 23 minutes total spent in  review of patient's record and preparation of their chart.

## 2019-01-04 ENCOUNTER — Encounter: Payer: Self-pay | Admitting: Family Medicine

## 2019-01-04 DIAGNOSIS — F3281 Premenstrual dysphoric disorder: Secondary | ICD-10-CM | POA: Insufficient documentation

## 2019-01-04 NOTE — Assessment & Plan Note (Signed)
Would like to start OCP to see if this might help. Will start monophasic OCP. Call with any concerns. Call with any concerns. Recheck 1 month.

## 2019-01-04 NOTE — Assessment & Plan Note (Signed)
Under good control on current regimen. Continue current regimen. Continue to monitor. Call with any concerns. Refills given.   

## 2019-01-31 ENCOUNTER — Other Ambulatory Visit: Payer: Self-pay | Admitting: Family Medicine

## 2019-02-05 ENCOUNTER — Encounter: Payer: Self-pay | Admitting: Family Medicine

## 2019-02-05 ENCOUNTER — Other Ambulatory Visit: Payer: Self-pay

## 2019-02-05 ENCOUNTER — Ambulatory Visit: Payer: BC Managed Care – PPO | Admitting: Family Medicine

## 2019-02-05 DIAGNOSIS — F3281 Premenstrual dysphoric disorder: Secondary | ICD-10-CM | POA: Diagnosis not present

## 2019-02-05 DIAGNOSIS — F339 Major depressive disorder, recurrent, unspecified: Secondary | ICD-10-CM

## 2019-02-05 NOTE — Progress Notes (Signed)
BP 123/87   Pulse 89   Temp 99.3 F (37.4 C) (Oral)   Ht 5\' 9"  (1.753 m)   Wt 207 lb (93.9 kg)   SpO2 95%   BMI 30.57 kg/m    Subjective:    Patient ID: Linda [name], female    DOB: [birth year], 38 y.o.   MRN: [id number]  HPI: Linda [name] is a 38 y.o. female  Chief Complaint  Patient presents with  . Contraception  . Depression    wellbutrin f/u   CONTRACEPTION CONCERNS Contraception: OCP Sexual activity: monogamous  Gravida/Para: G0 Length of menses: 2 weeks Flow: moderate Dysmenorrhea: no  DEPRESSION Mood status: better Satisfied with current treatment?: yes Symptom severity: mild  Duration of current treatment : months Side effects: no Medication compliance: excellent compliance Psychotherapy/counseling: no  Previous psychiatric medications: welbutrin, prozac Depressed mood: yes Anxious mood: yes Anhedonia: no Significant weight loss or gain: no Insomnia: no  Fatigue: yes Feelings of worthlessness or guilt: yes Impaired concentration/indecisiveness: yes Suicidal ideations: no Hopelessness: no Crying spells: yes Depression screen [facility] 2/9 02/05/2019 01/02/2019 06/16/2018 04/04/2018 03/10/2018  Decreased Interest 1 1 2 1 1   Down, Depressed, Hopeless 1 3 3 3 3   PHQ - 2 Score 2 4 5 4 4   Altered sleeping 0 3 1 0 2  Tired, decreased energy 1 3 2 1 1   Change in appetite 1 1 2 3 2   Feeling bad or failure about yourself  0 2 3 3 2   Trouble concentrating 2 3 1 3 2   Moving slowly or fidgety/restless 0 1 0 0 0  Suicidal thoughts 0 0 1 1 0  PHQ-9 Score 6 17 15 15 13   Difficult doing work/chores Somewhat difficult Somewhat difficult Somewhat difficult Very difficult -   GAD 7 : Generalized Anxiety Score 01/02/2019 06/16/2018 04/04/2018 03/10/2018  Nervous, Anxious, on Edge 1 2 2 1   Control/stop worrying 1 3 3 2   Worry too much - different things 1 2 3 2   Trouble relaxing 1 1 3 1   Restless 1 0 0 0  Easily annoyed or irritable 0 2 1 1   Afraid - awful might  happen 1 0 0 0  Total GAD 7 Score 6 10 12 7   Anxiety Difficulty Somewhat difficult Somewhat difficult Somewhat difficult Somewhat difficult    Relevant past medical, surgical, family and social history reviewed and updated as indicated. Interim medical history since our last visit reviewed. Allergies and medications reviewed and updated.  Review of Systems  Constitutional: Negative.   Respiratory: Negative.   Cardiovascular: Negative.   Musculoskeletal: Negative.   Skin: Negative.   Psychiatric/Behavioral: Positive for dysphoric mood. Negative for agitation, behavioral problems, confusion, decreased concentration, hallucinations, self-injury, sleep disturbance and suicidal ideas. The patient is nervous/anxious. The patient is not hyperactive.     Per HPI unless specifically indicated above     Objective:    BP 123/87   Pulse 89   Temp 99.3 F (37.4 C) (Oral)   Ht 5\' 9"  (1.753 m)   Wt 207 lb (93.9 kg)   SpO2 95%   BMI 30.57 kg/m   Wt Readings from Last 3 Encounters:  02/05/19 207 lb (93.9 kg)  06/16/18 205 lb 3.2 oz (93.1 kg)  05/08/18 201 lb (91.2 kg)    Physical Exam Vitals signs and nursing note reviewed.  Constitutional:      General: She is not in acute distress.    Appearance: Normal appearance. She is not ill-appearing, toxic-appearing or  diaphoretic.  HENT:     Head: Normocephalic and atraumatic.     Right Ear: External ear normal.     Left Ear: External ear normal.     Nose: Nose normal.     Mouth/Throat:     Mouth: Mucous membranes are moist.     Pharynx: Oropharynx is clear.  Eyes:     General: No scleral icterus.       Right eye: No discharge.        Left eye: No discharge.     Extraocular Movements: Extraocular movements intact.     Conjunctiva/sclera: Conjunctivae normal.     Pupils: Pupils are equal, round, and reactive to light.  Neck:     Musculoskeletal: Normal range of motion and neck supple.  Cardiovascular:     Rate and Rhythm: Normal  rate and regular rhythm.     Pulses: Normal pulses.     Heart sounds: Normal heart sounds. No murmur. No friction rub. No gallop.   Pulmonary:     Effort: Pulmonary effort is normal. No respiratory distress.     Breath sounds: Normal breath sounds. No stridor. No wheezing, rhonchi or rales.  Chest:     Chest wall: No tenderness.  Musculoskeletal: Normal range of motion.  Skin:    General: Skin is warm and dry.     Capillary Refill: Capillary refill takes less than 2 seconds.     Coloration: Skin is not jaundiced or pale.     Findings: No bruising, erythema, lesion or rash.  Neurological:     General: No focal deficit present.     Mental Status: She is alert and oriented to person, place, and time. Mental status is at baseline.  Psychiatric:        Mood and Affect: Mood normal.        Behavior: Behavior normal.        Thought Content: Thought content normal.        Judgment: Judgment normal.     Results for orders placed or performed in visit on 04/04/18  WET PREP FOR TRICH, YEAST, CLUE   Specimen: Vaginal Fluid   VAGINAL FLUI  Result Value Ref Range   Trichomonas Exam Negative Negative   Yeast Exam Negative Negative   Clue Cell Exam Positive (A) Negative  Cytology - PAP  Result Value Ref Range   Adequacy      Satisfactory for evaluation  endocervical/transformation zone component ABSENT.   Diagnosis      NEGATIVE FOR INTRAEPITHELIAL LESIONS OR MALIGNANCY.   HPV NOT DETECTED    Material Submitted CervicoVaginal Pap [ThinPrep Imaged]    CYTOLOGY - PAP PAP RESULT       Assessment & Plan:   Problem List Items Addressed This Visit      Other   Depression, recurrent (HCC)    Doing much better. Tolerating her OCP well. Continue current regimen. Continue to monitor. Recheck in about 3-5 months. Call with any concerns.       Relevant Medications   FLUoxetine (PROZAC) 40 MG capsule   PMDD (premenstrual dysphoric disorder)    Doing much better. Tolerating her OCP well.  Continue current regimen. Continue to monitor. Recheck in about 3-5 months. Call with any concerns.       Relevant Medications   FLUoxetine (PROZAC) 40 MG capsule       Follow up plan: Return 3-5 months, for follow up mood.

## 2019-02-08 ENCOUNTER — Encounter: Payer: Self-pay | Admitting: Family Medicine

## 2019-02-08 MED ORDER — FLUOXETINE HCL 40 MG PO CAPS: 40.0000 mg | ORAL_CAPSULE | Freq: Every day | ORAL | 1 refills | Status: DC

## 2019-02-08 NOTE — Assessment & Plan Note (Signed)
Doing much better. Tolerating her OCP well. Continue current regimen. Continue to monitor. Recheck in about 3-5 months. Call with any concerns.

## 2019-02-08 NOTE — Assessment & Plan Note (Signed)
Doing much better. Tolerating her OCP well. Continue current regimen. Continue to monitor. Recheck in about 3-5 months. Call with any concerns.  

## 2019-03-04 ENCOUNTER — Other Ambulatory Visit: Payer: Self-pay | Admitting: Family Medicine

## 2019-04-01 ENCOUNTER — Other Ambulatory Visit: Payer: Self-pay | Admitting: Family Medicine

## 2019-05-25 ENCOUNTER — Other Ambulatory Visit: Payer: Self-pay | Admitting: Family Medicine

## 2019-07-07 ENCOUNTER — Other Ambulatory Visit: Payer: Self-pay | Admitting: Family Medicine

## 2019-07-07 NOTE — Telephone Encounter (Signed)
Requested Prescriptions  Pending Prescriptions Disp Refills  . buPROPion (WELLBUTRIN XL) 300 MG 24 hr tablet [Pharmacy Med Name: buPROPion HCl ER (XL) 300 MG Oral Tablet Extended Release 24 Hour] 30 tablet 0    Sig: Take 1 tablet by mouth once daily     Psychiatry: Antidepressants - bupropion Passed - 07/07/2019 12:26 PM      Passed - Completed PHQ-2 or PHQ-9 in the last 360 days.      Passed - Last BP in normal range    BP Readings from Last 1 Encounters:  02/05/19 123/87         Passed - Valid encounter within last 6 months    Recent Outpatient Visits          5 months ago PMDD (premenstrual dysphoric disorder)   [facility] [place], Megan P, DO   6 months ago PMDD (premenstrual dysphoric disorder)   [facility] [place], Megan P, DO   1 year ago Depression, recurrent (HCC)   [facility] [place], Megan P, DO   1 year ago Depression, recurrent (HCC)   [facility] [place], Megan P, DO   1 year ago Routine general medical examination at a health care facility   [facility], [place] P, DO             . hydrOXYzine (ATARAX/VISTARIL) 25 MG tablet [Pharmacy Med Name: hydrOXYzine HCl 25 MG Oral Tablet] 90 tablet 0    Sig: Take 1 tablet by mouth three times daily as needed     Ear, Nose, and Throat:  Antihistamines Passed - 07/07/2019 12:26 PM      Passed - Valid encounter within last 12 months    Recent Outpatient Visits          5 months ago PMDD (premenstrual dysphoric disorder)   [facility], Megan P, DO   6 months ago PMDD (premenstrual dysphoric disorder)   [facility] [place], Megan P, DO   1 year ago Depression, recurrent (HCC)   [facility] [place], Megan P, DO   1 year ago Depression, recurrent (HCC)   [facility] [place], Megan P, DO   1 year ago Routine general medical examination at a health care facility   [facility], [place] P, DO

## 2019-08-11 ENCOUNTER — Other Ambulatory Visit: Payer: Self-pay | Admitting: Family Medicine

## 2019-08-11 NOTE — Telephone Encounter (Signed)
Needs appointment

## 2019-08-11 NOTE — Telephone Encounter (Signed)
Scheduled appt 08/25/19 needs an afternoon appt, states that she is out of medication completely

## 2019-08-11 NOTE — Telephone Encounter (Signed)
Requested medication (s) are due for refill today:   Yes  Requested medication (s) are on the active medication list:   Yes  Future visit scheduled:   No   Last ordered: 07/07/2019  #30  0 refills by Dr. Laural Benes.  Clinic note:  No valid encounter within the last 6 months.    30 day courtesy supply given in Jan.   Requested Prescriptions  Pending Prescriptions Disp Refills   buPROPion (WELLBUTRIN XL) 300 MG 24 hr tablet [Pharmacy Med Name: buPROPion HCl ER (XL) 300 MG Oral Tablet Extended Release 24 Hour] 30 tablet 0    Sig: Take 1 tablet by mouth once daily      Psychiatry: Antidepressants - bupropion Failed - 08/11/2019  9:03 AM      Failed - Valid encounter within last 6 months    Recent Outpatient Visits           6 months ago PMDD (premenstrual dysphoric disorder)   [facility], Megan P, DO   7 months ago PMDD (premenstrual dysphoric disorder)   [facility] [place], Megan P, DO   1 year ago Depression, recurrent (HCC)   [facility] [place], Megan P, DO   1 year ago Depression, recurrent (HCC)   [facility] [place], Megan P, DO   1 year ago Routine general medical examination at a health care facility   [facility], Megan P, DO              Passed - Completed PHQ-2 or PHQ-9 in the last 360 days.      Passed - Last BP in normal range    BP Readings from Last 1 Encounters:  02/05/19 123/87

## 2019-08-11 NOTE — Telephone Encounter (Signed)
LOV 01/2019

## 2019-08-25 ENCOUNTER — Ambulatory Visit: Payer: BC Managed Care – PPO | Admitting: Family Medicine

## 2019-08-25 ENCOUNTER — Encounter: Payer: Self-pay | Admitting: Family Medicine

## 2019-08-25 ENCOUNTER — Other Ambulatory Visit: Payer: Self-pay

## 2019-08-25 VITALS — BP 133/86 | HR 86 | Temp 98.2°F

## 2019-08-25 DIAGNOSIS — F3281 Premenstrual dysphoric disorder: Secondary | ICD-10-CM | POA: Diagnosis not present

## 2019-08-25 DIAGNOSIS — Z23 Encounter for immunization: Secondary | ICD-10-CM | POA: Diagnosis not present

## 2019-08-25 DIAGNOSIS — F339 Major depressive disorder, recurrent, unspecified: Secondary | ICD-10-CM

## 2019-08-25 MED ORDER — ESCITALOPRAM OXALATE 20 MG PO TABS: 20.0000 mg | ORAL_TABLET | Freq: Every day | ORAL | 3 refills | Status: DC

## 2019-08-25 MED ORDER — BUPROPION HCL ER (XL) 300 MG PO TB24: 300.0000 mg | ORAL_TABLET | Freq: Every day | ORAL | 1 refills | Status: DC

## 2019-08-25 MED ORDER — HYDROXYZINE HCL 25 MG PO TABS: 25.0000 mg | ORAL_TABLET | Freq: Three times a day (TID) | ORAL | 3 refills | Status: DC | PRN

## 2019-08-25 NOTE — Assessment & Plan Note (Signed)
Doing better but no sex drive on prozac. Will change to lexapro and recheck 1 month. Continue wellbutrin. May want to pursue neuropsych testing in the future for ADD- will let us know if she does.

## 2019-08-25 NOTE — Progress Notes (Signed)
BP 133/86 (BP Location: Left Arm, Patient Position: Sitting, Cuff Size: Normal)   Pulse 86   Temp 98.2 F (36.8 C) (Oral)   SpO2 97%    Subjective:    Patient ID: Linda [name], female    DOB: 08/13/1980, 39 y.o.   MRN: [id number]  HPI: Linda [name] is a 39 y.o. female  Chief Complaint  Patient presents with  . Depression   DEPRESSION Mood status: better Satisfied with current treatment?: yes Symptom severity: mild  Duration of current treatment : chronic Side effects: no Medication compliance: excellent compliance Psychotherapy/counseling: no  Previous psychiatric medications: fluoxetine, wellbutrin, lorzepam Depressed mood: yes Anxious mood: yes Anhedonia: no Significant weight loss or gain: no Insomnia: no  Fatigue: yes Feelings of worthlessness or guilt: no Impaired concentration/indecisiveness: yes Suicidal ideations: no Hopelessness: no Crying spells: no Depression screen [facility] 2/9 02/05/2019 01/02/2019 06/16/2018 04/04/2018 03/10/2018  Decreased Interest 1 1 2 1 1   Down, Depressed, Hopeless 1 3 3 3 3   PHQ - 2 Score 2 4 5 4 4   Altered sleeping 0 3 1 0 2  Tired, decreased energy 1 3 2 1 1   Change in appetite 1 1 2 3 2   Feeling bad or failure about yourself  0 2 3 3 2   Trouble concentrating 2 3 1 3 2   Moving slowly or fidgety/restless 0 1 0 0 0  Suicidal thoughts 0 0 1 1 0  PHQ-9 Score 6 17 15 15 13   Difficult doing work/chores Somewhat difficult Somewhat difficult Somewhat difficult Very difficult -   GAD 7 : Generalized Anxiety Score 01/02/2019 06/16/2018 04/04/2018 03/10/2018  Nervous, Anxious, on Edge 1 2 2 1   Control/stop worrying 1 3 3 2   Worry too much - different things 1 2 3 2   Trouble relaxing 1 1 3 1   Restless 1 0 0 0  Easily annoyed or irritable 0 2 1 1   Afraid - awful might happen 1 0 0 0  Total GAD 7 Score 6 10 12 7   Anxiety Difficulty Somewhat difficult Somewhat difficult Somewhat difficult Somewhat difficult   Relevant past medical,  surgical, family and social history reviewed and updated as indicated. Interim medical history since our last visit reviewed. Allergies and medications reviewed and updated.  Review of Systems  Constitutional: Negative.   Respiratory: Negative.   Cardiovascular: Negative.   Musculoskeletal: Negative.   Skin: Negative.   Psychiatric/Behavioral: Positive for decreased concentration and dysphoric mood. Negative for agitation, behavioral problems, confusion, hallucinations, self-injury, sleep disturbance and suicidal ideas. The patient is nervous/anxious. The patient is not hyperactive.     Per HPI unless specifically indicated above     Objective:    BP 133/86 (BP Location: Left Arm, Patient Position: Sitting, Cuff Size: Normal)   Pulse 86   Temp 98.2 F (36.8 C) (Oral)   SpO2 97%   Wt Readings from Last 3 Encounters:  02/05/19 207 lb (93.9 kg)  06/16/18 205 lb 3.2 oz (93.1 kg)  05/08/18 201 lb (91.2 kg)    Physical Exam Vitals and nursing note reviewed.  Constitutional:      General: She is not in acute distress.    Appearance: Normal appearance. She is not ill-appearing, toxic-appearing or diaphoretic.  HENT:     Head: Normocephalic and atraumatic.     Right Ear: External ear normal.     Left Ear: External ear normal.     Nose: Nose normal.     Mouth/Throat:     Mouth:  Mucous membranes are moist.     Pharynx: Oropharynx is clear.  Eyes:     General: No scleral icterus.       Right eye: No discharge.        Left eye: No discharge.     Extraocular Movements: Extraocular movements intact.     Conjunctiva/sclera: Conjunctivae normal.     Pupils: Pupils are equal, round, and reactive to light.  Cardiovascular:     Rate and Rhythm: Normal rate and regular rhythm.     Pulses: Normal pulses.     Heart sounds: Normal heart sounds. No murmur. No friction rub. No gallop.   Pulmonary:     Effort: Pulmonary effort is normal. No respiratory distress.     Breath sounds: Normal  breath sounds. No stridor. No wheezing, rhonchi or rales.  Chest:     Chest wall: No tenderness.  Musculoskeletal:        General: Normal range of motion.     Cervical back: Normal range of motion and neck supple.  Skin:    General: Skin is warm and dry.     Capillary Refill: Capillary refill takes less than 2 seconds.     Coloration: Skin is not jaundiced or pale.     Findings: No bruising, erythema, lesion or rash.  Neurological:     General: No focal deficit present.     Mental Status: She is alert and oriented to person, place, and time. Mental status is at baseline.  Psychiatric:        Mood and Affect: Mood normal.        Behavior: Behavior normal.        Thought Content: Thought content normal.        Judgment: Judgment normal.     Results for orders placed or performed in visit on 04/04/18  WET PREP FOR TRICH, YEAST, CLUE   Specimen: Vaginal Fluid   VAGINAL FLUI  Result Value Ref Range   Trichomonas Exam Negative Negative   Yeast Exam Negative Negative   Clue Cell Exam Positive (A) Negative  Cytology - PAP  Result Value Ref Range   Adequacy      Satisfactory for evaluation  endocervical/transformation zone component ABSENT.   Diagnosis      NEGATIVE FOR INTRAEPITHELIAL LESIONS OR MALIGNANCY.   HPV NOT DETECTED    Material Submitted CervicoVaginal Pap [ThinPrep Imaged]    CYTOLOGY - PAP PAP RESULT       Assessment & Plan:   Problem List Items Addressed This Visit      Other   Depression, recurrent (HCC)    Doing better but no sex drive on prozac. Will change to lexapro and recheck 1 month. Continue wellbutrin. May want to pursue neuropsych testing in the future for ADD- will let us know if she does.       Relevant Medications   escitalopram (LEXAPRO) 20 MG tablet   buPROPion (WELLBUTRIN XL) 300 MG 24 hr tablet   hydrOXYzine (ATARAX/VISTARIL) 25 MG tablet   PMDD (premenstrual dysphoric disorder) - Primary    Doing better but no sex drive on prozac. Will  change to lexapro and recheck 1 month. Continue wellbutrin. May want to pursue neuropsych testing in the future for ADD- will let us know if she does.       Relevant Medications   escitalopram (LEXAPRO) 20 MG tablet   buPROPion (WELLBUTRIN XL) 300 MG 24 hr tablet   hydrOXYzine (ATARAX/VISTARIL) 25 MG tablet  Other Visit Diagnoses    Immunization due       Flu shot given today.   Relevant Orders   Flu Vaccine QUAD 6+ mos PF IM (Fluarix Quad PF) (Completed)       Follow up plan: Return in about 4 weeks (around 09/22/2019).

## 2019-08-25 NOTE — Assessment & Plan Note (Signed)
Doing better but no sex drive on prozac. Will change to lexapro and recheck 1 month. Continue wellbutrin. May want to pursue neuropsych testing in the future for ADD- will let us know if she does.  

## 2019-11-02 ENCOUNTER — Encounter: Payer: Self-pay | Admitting: Family Medicine

## 2019-11-02 ENCOUNTER — Telehealth (INDEPENDENT_AMBULATORY_CARE_PROVIDER_SITE_OTHER): Payer: Managed Care, Other (non HMO) | Admitting: Family Medicine

## 2019-11-02 VITALS — BP 125/90

## 2019-11-02 DIAGNOSIS — F339 Major depressive disorder, recurrent, unspecified: Secondary | ICD-10-CM

## 2019-11-02 DIAGNOSIS — F3281 Premenstrual dysphoric disorder: Secondary | ICD-10-CM

## 2019-11-02 MED ORDER — ESCITALOPRAM OXALATE 20 MG PO TABS: 20.0000 mg | ORAL_TABLET | Freq: Every day | ORAL | 1 refills | Status: DC

## 2019-11-02 MED ORDER — BUPROPION HCL ER (XL) 300 MG PO TB24: 300.0000 mg | ORAL_TABLET | Freq: Every day | ORAL | 1 refills | Status: DC

## 2019-11-02 MED ORDER — HYDROXYZINE HCL 25 MG PO TABS: 25.0000 mg | ORAL_TABLET | Freq: Three times a day (TID) | ORAL | 1 refills | Status: DC | PRN

## 2019-11-02 NOTE — Assessment & Plan Note (Signed)
Doing well on current regimen. Still having issues with sex drive. Would like to stay on current regimen for now. Call with any concerns- if wants to change, consider trintellix. Recheck 3 months at physical.  

## 2019-11-02 NOTE — Progress Notes (Signed)
BP 125/90    Subjective:    Patient ID: Dan Maker, female    DOB: Mar 31, 1981, 39 y.o.   MRN: [id number]  HPI: MALIE KASHANI is a 39 y.o. female  No chief complaint on file.  ANXIETY/DEPRESSION- feeling better on the lexapro. Has been going back and forth between having more energy and not feeling not like herself. Still having sexual side effects, but it's a bit better.  Duration:stable Anxious mood: yes  Excessive worrying: no Irritability: no  Sweating: no Nausea: no Palpitations:no Hyperventilation: no Panic attacks: no Agoraphobia: no  Obscessions/compulsions: no Depressed mood: yes Depression screen [facility] 2/9 11/02/2019 08/25/2019 02/05/2019 01/02/2019 06/16/2018  Decreased Interest 2 2 1 1 2   Down, Depressed, Hopeless 1 2 1 3 3   PHQ - 2 Score 3 4 2 4 5   Altered sleeping 1 1 0 3 1  Tired, decreased energy 1 1 1 3 2   Change in appetite 0 0 1 1 2   Feeling bad or failure about yourself  0 1 0 2 3  Trouble concentrating 2 3 2 3 1   Moving slowly or fidgety/restless 0 0 0 1 0  Suicidal thoughts 0 0 0 0 1  PHQ-9 Score 7 10 6 17 15   Difficult doing work/chores Somewhat difficult Somewhat difficult Somewhat difficult Somewhat difficult Somewhat difficult   GAD 7 : Generalized Anxiety Score 11/02/2019 01/02/2019 06/16/2018 04/04/2018  Nervous, Anxious, on Edge 1 1 2 2   Control/stop worrying 1 1 3 3   Worry too much - different things 1 1 2 3   Trouble relaxing 0 1 1 3   Restless 0 1 0 0  Easily annoyed or irritable 1 0 2 1  Afraid - awful might happen 0 1 0 0  Total GAD 7 Score 4 6 10 12   Anxiety Difficulty Not difficult at all Somewhat difficult Somewhat difficult Somewhat difficult   Anhedonia: no Weight changes: no Insomnia: no   Hypersomnia: no Fatigue/loss of energy: no Feelings of worthlessness: yes Feelings of guilt: yes Impaired concentration/indecisiveness: yes Suicidal ideations: no  Crying spells: no Recent Stressors/Life Changes: yes   Relationship  problems: no   Family stress: no     Financial stress: no    Job stress: no    Recent death/loss: no  Relevant past medical, surgical, family and social history reviewed and updated as indicated. Interim medical history since our last visit reviewed. Allergies and medications reviewed and updated.  Review of Systems  Constitutional: Negative.   Respiratory: Negative.   Cardiovascular: Negative.   Gastrointestinal: Negative.   Musculoskeletal: Negative.   Skin: Negative.   Psychiatric/Behavioral: Positive for dysphoric mood. Negative for agitation, behavioral problems, confusion, decreased concentration, hallucinations, self-injury, sleep disturbance and suicidal ideas. The patient is nervous/anxious. The patient is not hyperactive.     Per HPI unless specifically indicated above     Objective:    BP 125/90   Wt Readings from Last 3 Encounters:  02/05/19 207 lb (93.9 kg)  06/16/18 205 lb 3.2 oz (93.1 kg)  05/08/18 201 lb (91.2 kg)    Physical Exam Vitals and nursing note reviewed.  Constitutional:      General: She is not in acute distress.    Appearance: Normal appearance. She is not ill-appearing, toxic-appearing or diaphoretic.  HENT:     Head: Normocephalic and atraumatic.     Right Ear: External ear normal.     Left Ear: External ear normal.     Nose: Nose normal.  Mouth/Throat:     Mouth: Mucous membranes are moist.     Pharynx: Oropharynx is clear.  Eyes:     General: No scleral icterus.       Right eye: No discharge.        Left eye: No discharge.     Conjunctiva/sclera: Conjunctivae normal.     Pupils: Pupils are equal, round, and reactive to light.  Pulmonary:     Effort: Pulmonary effort is normal. No respiratory distress.     Comments: Speaking in full sentences Musculoskeletal:        General: Normal range of motion.     Cervical back: Normal range of motion.  Skin:    Coloration: Skin is not jaundiced or pale.     Findings: No bruising,  erythema, lesion or rash.  Neurological:     Mental Status: She is alert and oriented to person, place, and time. Mental status is at baseline.  Psychiatric:        Mood and Affect: Mood normal.        Behavior: Behavior normal.        Thought Content: Thought content normal.        Judgment: Judgment normal.     Results for orders placed or performed in visit on 04/04/18  WET PREP FOR [place], YEAST, CLUE   Specimen: Vaginal Fluid   VAGINAL FLUI  Result Value Ref Range   Trichomonas Exam Negative Negative   Yeast Exam Negative Negative   Clue Cell Exam Positive (A) Negative  Cytology - PAP  Result Value Ref Range   Adequacy      Satisfactory for evaluation  endocervical/transformation zone component ABSENT.   Diagnosis      NEGATIVE FOR INTRAEPITHELIAL LESIONS OR MALIGNANCY.   HPV NOT DETECTED    Material Submitted CervicoVaginal Pap [ThinPrep Imaged]    CYTOLOGY - PAP PAP RESULT       Assessment & Plan:   Problem List Items Addressed This Visit      Other   Depression, recurrent ([place])    Doing well on current regimen. Still having issues with sex drive. Would like to stay on current regimen for now. Call with any concerns- if wants to change, consider trintellix. Recheck 3 months at physical.       Relevant Medications   buPROPion (WELLBUTRIN XL) 300 MG 24 hr tablet   escitalopram (LEXAPRO) 20 MG tablet   hydrOXYzine (ATARAX/VISTARIL) 25 MG tablet   PMDD (premenstrual dysphoric disorder) - Primary    Doing well on current regimen. Still having issues with sex drive. Would like to stay on current regimen for now. Call with any concerns- if wants to change, consider trintellix. Recheck 3 months at physical.       Relevant Medications   buPROPion (WELLBUTRIN XL) 300 MG 24 hr tablet   escitalopram (LEXAPRO) 20 MG tablet   hydrOXYzine (ATARAX/VISTARIL) 25 MG tablet       Follow up plan: Return in about 3 months (around 02/02/2020) for Physical.   . This visit was  completed via MyChart due to the restrictions of the COVID-19 pandemic. All issues as above were discussed and addressed. Physical exam was done as above through visual confirmation on MyChart. If it was felt that the patient should be evaluated in the office, they were directed there. The patient verbally consented to this visit. . Location of the patient: home . Location of the provider: work . Those involved with this call:  . Provider:  Olevia Perches, DO . CMA: Floydene Flock, RMA . Front Desk/Registration: Adela Ports  . Time spent on call: 15 minutes with patient face to face via video conference. More than 50% of this time was spent in counseling and coordination of care. 23 minutes total spent in review of patient's record and preparation of their chart.

## 2019-11-02 NOTE — Assessment & Plan Note (Signed)
Doing well on current regimen. Still having issues with sex drive. Would like to stay on current regimen for now. Call with any concerns- if wants to change, consider trintellix. Recheck 3 months at physical.

## 2019-11-03 ENCOUNTER — Telehealth: Payer: Self-pay | Admitting: Family Medicine

## 2019-11-03 ENCOUNTER — Encounter: Payer: Self-pay | Admitting: Family Medicine

## 2019-11-03 NOTE — Telephone Encounter (Signed)
-----   Message from Dorcas Carrow, [place] sent at 11/02/2019  1:19 PM EDT ----- 3 months physical

## 2019-11-03 NOTE — Telephone Encounter (Signed)
LVM, sent letter.

## 2019-11-04 NOTE — Telephone Encounter (Signed)
Lvm with spouse (DPR) to call back to make this apt.

## 2020-02-16 ENCOUNTER — Encounter: Payer: Managed Care, Other (non HMO) | Admitting: Family Medicine

## 2020-06-28 ENCOUNTER — Other Ambulatory Visit: Payer: Self-pay | Admitting: Family Medicine

## 2020-11-21 ENCOUNTER — Ambulatory Visit (INDEPENDENT_AMBULATORY_CARE_PROVIDER_SITE_OTHER): Payer: 59 | Admitting: Family Medicine

## 2020-11-21 ENCOUNTER — Encounter: Payer: Self-pay | Admitting: Family Medicine

## 2020-11-21 ENCOUNTER — Other Ambulatory Visit: Payer: Self-pay

## 2020-11-21 VITALS — BP 108/72 | HR 88 | Ht 69.0 in | Wt 231.0 lb

## 2020-11-21 DIAGNOSIS — Z0000 Encounter for general adult medical examination without abnormal findings: Secondary | ICD-10-CM

## 2020-11-21 DIAGNOSIS — Z1231 Encounter for screening mammogram for malignant neoplasm of breast: Secondary | ICD-10-CM

## 2020-11-21 DIAGNOSIS — Z113 Encounter for screening for infections with a predominantly sexual mode of transmission: Secondary | ICD-10-CM

## 2020-11-21 DIAGNOSIS — R300 Dysuria: Secondary | ICD-10-CM

## 2020-11-21 DIAGNOSIS — F3281 Premenstrual dysphoric disorder: Secondary | ICD-10-CM | POA: Diagnosis not present

## 2020-11-21 DIAGNOSIS — Z23 Encounter for immunization: Secondary | ICD-10-CM | POA: Diagnosis not present

## 2020-11-21 DIAGNOSIS — F339 Major depressive disorder, recurrent, unspecified: Secondary | ICD-10-CM

## 2020-11-21 LAB — URINALYSIS, ROUTINE W REFLEX MICROSCOPIC
Bilirubin, UA: NEGATIVE
Glucose, UA: NEGATIVE
Ketones, UA: NEGATIVE
Leukocytes,UA: NEGATIVE
Nitrite, UA: NEGATIVE
Protein,UA: NEGATIVE
Specific Gravity, UA: 1.015 (ref 1.005–1.030)
Urobilinogen, Ur: 0.2 mg/dL (ref 0.2–1.0)
pH, UA: 8.5 — ABNORMAL HIGH (ref 5.0–7.5)

## 2020-11-21 LAB — MICROSCOPIC EXAMINATION: WBC, UA: NONE SEEN /hpf (ref 0–5)

## 2020-11-21 NOTE — Patient Instructions (Addendum)
Call to schedule your mammogram: [facility]  Address: [address], [place], [place] [address]  Phone: [phone]   Health Maintenance, Female Adopting a healthy lifestyle and getting preventive care are important in promoting health and wellness. Ask your health care provider about:  The right schedule for you to have regular tests and exams.  Things you can do on your own to prevent diseases and keep yourself healthy. What should I know about diet, weight, and exercise? Eat a healthy diet  Eat a diet that includes plenty of vegetables, fruits, low-fat dairy products, and lean protein.  Do not eat a lot of foods that are high in solid fats, added sugars, or sodium.   Maintain a healthy weight Body mass index (BMI) is used to identify weight problems. It estimates body fat based on height and weight. Your health care provider can help determine your BMI and help you achieve or maintain a healthy weight. Get regular exercise Get regular exercise. This is one of the most important things you can do for your health. Most adults should:  Exercise for at least 150 minutes each week. The exercise should increase your heart rate and make you sweat (moderate-intensity exercise).  Do strengthening exercises at least twice a week. This is in addition to the moderate-intensity exercise.  Spend less time sitting. Even light physical activity can be beneficial. Watch cholesterol and blood lipids Have your blood tested for lipids and cholesterol at 40 years of age, then have this test every 5 years. Have your cholesterol levels checked more often if:  Your lipid or cholesterol levels are high.  You are older than 40 years of age.  You are at high risk for heart disease. What should I know about cancer screening? Depending on your health history and family history, you may need to have cancer screening at various ages. This may include screening  for:  Breast cancer.  Cervical cancer.  Colorectal cancer.  Skin cancer.  Lung cancer. What should I know about heart disease, diabetes, and high blood pressure? Blood pressure and heart disease  High blood pressure causes heart disease and increases the risk of stroke. This is more likely to develop in people who have high blood pressure readings, are of African descent, or are overweight.  Have your blood pressure checked: ? Every 3-5 years if you are 18-39 years of age. ? Every year if you are 40 years old or older. Diabetes Have regular diabetes screenings. This checks your fasting blood sugar level. Have the screening done:  Once every three years after age 40 if you are at a normal weight and have a low risk for diabetes.  More often and at a younger age if you are overweight or have a high risk for diabetes. What should I know about preventing infection? Hepatitis B If you have a higher risk for hepatitis B, you should be screened for this virus. Talk with your health care provider to find out if you are at risk for hepatitis B infection. Hepatitis C Testing is recommended for:  Everyone born from 1945 through 1965.  Anyone with known risk factors for hepatitis C. Sexually transmitted infections (STIs)  Get screened for STIs, including gonorrhea and chlamydia, if: ? You are sexually active and are younger than 40 years of age. ? You are older than 40 years of age and your health care provider tells you that you are at risk for this type of   infection. ? Your sexual activity has changed since you were last screened, and you are at increased risk for chlamydia or gonorrhea. Ask your health care provider if you are at risk.  Ask your health care provider about whether you are at high risk for HIV. Your health care provider may recommend a prescription medicine to help prevent HIV infection. If you choose to take medicine to prevent HIV, you should first get tested for HIV.  You should then be tested every 3 months for as long as you are taking the medicine. Pregnancy  If you are about to stop having your period (premenopausal) and you may become pregnant, seek counseling before you get pregnant.  Take 400 to 800 micrograms (mcg) of folic acid every day if you become pregnant.  Ask for birth control (contraception) if you want to prevent pregnancy. Osteoporosis and menopause Osteoporosis is a disease in which the bones lose minerals and strength with aging. This can result in bone fractures. If you are 33 years old or older, or if you are at risk for osteoporosis and fractures, ask your health care provider if you should:  Be screened for bone loss.  Take a calcium or vitamin D supplement to lower your risk of fractures.  Be given hormone replacement therapy (HRT) to treat symptoms of menopause. Follow these instructions at home: Lifestyle  Do not use any products that contain nicotine or tobacco, such as cigarettes, e-cigarettes, and chewing tobacco. If you need help quitting, ask your health care provider.  Do not use street drugs.  Do not share needles.  Ask your health care provider for help if you need support or information about quitting drugs. Alcohol use  Do not drink alcohol if: ? Your health care provider tells you not to drink. ? You are pregnant, may be pregnant, or are planning to become pregnant.  If you drink alcohol: ? Limit how much you use to 0-1 drink a day. ? Limit intake if you are breastfeeding.  Be aware of how much alcohol is in your drink. In the [place] one drink equals one 12 oz bottle of beer (355 mL), one 5 oz glass of wine (148 mL), or one 1 oz glass of hard liquor (44 mL). General instructions  Schedule regular health, dental, and eye exams.  Stay current with your vaccines.  Tell your health care provider if: ? You often feel depressed. ? You have ever been abused or do not feel safe at  home. Summary  Adopting a healthy lifestyle and getting preventive care are important in promoting health and wellness.  Follow your health care provider's instructions about healthy diet, exercising, and getting tested or screened for diseases.  Follow your health care provider's instructions on monitoring your cholesterol and blood pressure. This information is not intended to replace advice given to you by your health care provider. Make sure you discuss any questions you have with your health care provider. Document Revised: 05/28/2018 Document Reviewed: 05/28/2018 Elsevier Patient Education  2021 Elsevier Inc. Pneumococcal Polysaccharide Vaccine (PPSV23): What You Need to Know 1. Why get vaccinated? Pneumococcal polysaccharide vaccine (PPSV23) can prevent pneumococcal disease. Pneumococcal disease refers to any illness caused by pneumococcal bacteria. These bacteria can cause many types of illnesses, including pneumonia, which is an infection of the lungs. Pneumococcal bacteria are one of the most common causes of pneumonia. Besides pneumonia, pneumococcal bacteria can also cause:  Ear infections  Sinus infections  Meningitis (infection of the tissue covering the brain  and spinal cord)  Bacteremia (bloodstream infection) Anyone can get pneumococcal disease, but children under 1 years of age, people with certain medical conditions, adults 65 years or older, and cigarette smokers are at the highest risk. Most pneumococcal infections are mild. However, some can result in long-term problems, such as brain damage or hearing loss. Meningitis, bacteremia, and pneumonia caused by pneumococcal disease can be fatal. 2. PPSV23 PPSV23 protects against 23 types of bacteria that cause pneumococcal disease. PPSV23 is recommended for:  All adults 65 years or older,  Anyone 2 years or older with certain medical conditions that can lead to an increased risk for pneumococcal disease. Most people  need only one dose of PPSV23. A second dose of PPSV23, and another type of pneumococcal vaccine called PCV13, are recommended for certain high-risk groups. Your health care provider can give you more information. People 65 years or older should get a dose of PPSV23 even if they have already gotten one or more doses of the vaccine before they turned 68. 3. Talk with your health care provider Tell your vaccine provider if the person getting the vaccine:  Has had an allergic reaction after a previous dose of PPSV23, or has any severe, life-threatening allergies. In some cases, your health care provider may decide to postpone PPSV23 vaccination to a future visit. People with minor illnesses, such as a cold, may be vaccinated. People who are moderately or severely ill should usually wait until they recover before getting PPSV23. Your health care provider can give you more information. 4. Risks of a vaccine reaction  Redness or pain where the shot is given, feeling tired, fever, or muscle aches can happen after PPSV23. People sometimes faint after medical procedures, including vaccination. Tell your provider if you feel dizzy or have vision changes or ringing in the ears. As with any medicine, there is a very remote chance of a vaccine causing a severe allergic reaction, other serious injury, or death. 5. What if there is a serious problem? An allergic reaction could occur after the vaccinated person leaves the clinic. If you see signs of a severe allergic reaction (hives, swelling of the face and throat, difficulty breathing, a fast heartbeat, dizziness, or weakness), call 9-1-1 and get the person to the nearest hospital. For other signs that concern you, call your health care provider. Adverse reactions should be reported to the Vaccine Adverse Event Reporting System (VAERS). Your health care provider will usually file this report, or you can do it yourself. Visit the VAERS website at www.vaers.LAgents.no  or call 253-847-8221. VAERS is only for reporting reactions, and VAERS staff do not give medical advice. 6. How can I learn more?  Ask your health care provider.  Call your local or state health department.  Contact the Centers for Disease Control and Prevention (CDC): ? Call (226)095-3657 (1-800-CDC-INFO) or ? Visit CDC's website at PicCapture.uy Vaccine Information Statement PPSV23 Vaccine (04/16/2018) This information is not intended to replace advice given to you by your health care provider. Make sure you discuss any questions you have with your health care provider. Document Revised: 02/05/2020 Document Reviewed: 02/05/2020 Elsevier Patient Education  2021 [employer].

## 2020-11-21 NOTE — Addendum Note (Signed)
Addended by: Rolley Sims on: 11/21/2020 01:07 PM   Modules accepted: Orders

## 2020-11-21 NOTE — Progress Notes (Signed)
BP 108/72   Pulse 88   Ht 5\' 9"  (1.753 m)   Wt 231 lb (104.8 kg)   SpO2 97%   BMI 34.11 kg/m    Subjective:    Patient ID: , female    DOB: 1981-03-14, 40 y.o.   MRN: [id number]  HPI: Linda [name] is a 40 y.o. female presenting on 11/21/2020 for comprehensive medical examination. Current medical complaints include:  DEPRESSION Mood status: controlled Satisfied with current treatment?: yes Symptom severity: moderate  Duration of current treatment : chronic Side effects: no Medication compliance: excellent compliance Psychotherapy/counseling: no  Previous psychiatric medications: wellbutrin, lexapro Depressed mood: yes Anxious mood: no Anhedonia: no Significant weight loss or gain: no Insomnia: no  Fatigue: yes Feelings of worthlessness or guilt: no Impaired concentration/indecisiveness: no Suicidal ideations: no Hopelessness: no Crying spells: no Depression screen [facility] 2/9 11/21/2020 11/02/2019 08/25/2019 02/05/2019 01/02/2019  Decreased Interest 1 2 2 1 1   Down, Depressed, Hopeless 2 1 2 1 3   PHQ - 2 Score 3 3 4 2 4   Altered sleeping 0 1 1 0 3  Tired, decreased energy 2 1 1 1 3   Change in appetite 1 0 0 1 1  Feeling bad or failure about yourself  2 0 1 0 2  Trouble concentrating 3 2 3 2 3   Moving slowly or fidgety/restless 0 0 0 0 1  Suicidal thoughts 0 0 0 0 0  PHQ-9 Score 11 7 10 6 17   Difficult doing work/chores Somewhat difficult Somewhat difficult Somewhat difficult Somewhat difficult Somewhat difficult     She currently lives with: Menopausal Symptoms: no  Depression Screen done today and results listed below:  Depression screen [facility] 2/9 11/21/2020 11/02/2019 08/25/2019 02/05/2019 01/02/2019  Decreased Interest 1 2 2 1 1   Down, Depressed, Hopeless 2 1 2 1 3   PHQ - 2 Score 3 3 4 2 4   Altered sleeping 0 1 1 0 3  Tired, decreased energy 2 1 1 1 3   Change in appetite 1 0 0 1 1  Feeling bad or failure about yourself  2 0 1 0 2  Trouble concentrating 3 2 3  2 3   Moving slowly or fidgety/restless 0 0 0 0 1  Suicidal thoughts 0 0 0 0 0  PHQ-9 Score 11 7 10 6 17   Difficult doing work/chores Somewhat difficult Somewhat difficult Somewhat difficult Somewhat difficult Somewhat difficult     Past Medical History:  Past Medical History:  Diagnosis Date  . Hyperlipidemia   . PCOS (polycystic ovarian syndrome)   . Smoker   . Thyroid disease     Surgical History:  Past Surgical History:  Procedure Laterality Date  . lymph nodes surgery      Medications:  Current Outpatient Medications on File Prior to Visit  Medication Sig  . buPROPion (WELLBUTRIN XL) 300 MG 24 hr tablet Take 1 tablet by mouth once daily  . Calcium Carbonate-Vitamin D (CALCIUM-D PO) Take by mouth daily.  . hydrOXYzine (ATARAX/VISTARIL) 25 MG tablet Take 1 tablet (25 mg total) by mouth 3 (three) times daily as needed.  . Multiple Vitamins-Minerals (MULTIVITAMIN PO) Take by mouth daily.  [facility] VITAMIN D PO Take by mouth 2 (two) times daily.   No current facility-administered medications on file prior to visit.    Allergies:  No Known Allergies  Social History:  Social History   Socioeconomic History  . Marital status: Married    Spouse name: Not on file  . Number  of children: Not on file  . Years of education: Not on file  . Highest education level: Not on file  Occupational History  . Not on file  Tobacco Use  . Smoking status: Current Every Day Smoker    Packs/day: 1.00    Types: Cigarettes  . Smokeless tobacco: Never Used  Vaping Use  . Vaping Use: Never used  Substance and Sexual Activity  . Alcohol use: Yes    Alcohol/week: 2.0 standard drinks    Types: 2 Glasses of wine per week  . Drug use: Never  . Sexual activity: Yes    Birth control/protection: None  Other Topics Concern  . Not on file  Social History Narrative  . Not on file   Social Determinants of Health   Financial Resource Strain: Not on file  Food Insecurity: Not on file   Transportation Needs: Not on file  Physical Activity: Not on file  Stress: Not on file  Social Connections: Not on file  Intimate Partner Violence: Not on file   Social History   Tobacco Use  Smoking Status Current Every Day Smoker  . Packs/day: 1.00  . Types: Cigarettes  Smokeless Tobacco Never Used   Social History   Substance and Sexual Activity  Alcohol Use Yes  . Alcohol/week: 2.0 standard drinks  . Types: 2 Glasses of wine per week    Family History:  Family History  Problem Relation Age of Onset  . Dementia Paternal Grandmother     Past medical history, surgical history, medications, allergies, family history and social history reviewed with patient today and changes made to appropriate areas of the chart.   Review of Systems  Constitutional: Negative.   HENT: Negative.   Eyes: Negative.   Respiratory: Negative.   Cardiovascular: Negative.   Gastrointestinal: Negative.   Genitourinary: Negative.   Musculoskeletal: Negative.   Skin: Positive for rash. Negative for itching.  Neurological: Negative.   Endo/Heme/Allergies: Negative.   Psychiatric/Behavioral: Negative.    All other ROS negative except what is listed above and in the HPI.      Objective:    BP 108/72   Pulse 88   Ht 5\' 9"  (1.753 m)   Wt 231 lb (104.8 kg)   SpO2 97%   BMI 34.11 kg/m   Wt Readings from Last 3 Encounters:  11/21/20 231 lb (104.8 kg)  02/05/19 207 lb (93.9 kg)  06/16/18 205 lb 3.2 oz (93.1 kg)    Physical Exam Vitals and nursing note reviewed.  Constitutional:      General: She is not in acute distress.    Appearance: Normal appearance. She is not ill-appearing, toxic-appearing or diaphoretic.  HENT:     Head: Normocephalic and atraumatic.     Right Ear: Tympanic membrane, ear canal and external ear normal. There is no impacted cerumen.     Left Ear: Tympanic membrane, ear canal and external ear normal. There is no impacted cerumen.     Nose: Nose normal. No  congestion or rhinorrhea.     Mouth/Throat:     Mouth: Mucous membranes are moist.     Pharynx: Oropharynx is clear. No oropharyngeal exudate or posterior oropharyngeal erythema.  Eyes:     General: No scleral icterus.       Right eye: No discharge.        Left eye: No discharge.     Extraocular Movements: Extraocular movements intact.     Conjunctiva/sclera: Conjunctivae normal.     Pupils: Pupils are  equal, round, and reactive to light.  Neck:     Vascular: No carotid bruit.  Cardiovascular:     Rate and Rhythm: Normal rate and regular rhythm.     Pulses: Normal pulses.     Heart sounds: No murmur heard. No friction rub. No gallop.   Pulmonary:     Effort: Pulmonary effort is normal. No respiratory distress.     Breath sounds: Normal breath sounds. No stridor. No wheezing, rhonchi or rales.  Chest:     Chest wall: No tenderness.  Abdominal:     General: Abdomen is flat. Bowel sounds are normal. There is no distension.     Palpations: Abdomen is soft. There is no mass.     Tenderness: There is no abdominal tenderness. There is no right CVA tenderness, left CVA tenderness, guarding or rebound.     Hernia: No hernia is present.  Genitourinary:    Comments: Breast and pelvic exams deferred with shared decision making Musculoskeletal:        General: No swelling, tenderness, deformity or signs of injury.     Cervical back: Normal range of motion and neck supple. No rigidity. No muscular tenderness.     Right lower leg: No edema.     Left lower leg: No edema.  Lymphadenopathy:     Cervical: No cervical adenopathy.  Skin:    General: Skin is warm and dry.     Capillary Refill: Capillary refill takes less than 2 seconds.     Coloration: Skin is not jaundiced or pale.     Findings: No bruising, erythema, lesion or rash.  Neurological:     General: No focal deficit present.     Mental Status: She is alert and oriented to person, place, and time. Mental status is at baseline.      Cranial Nerves: No cranial nerve deficit.     Sensory: No sensory deficit.     Motor: No weakness.     Coordination: Coordination normal.     Gait: Gait normal.     Deep Tendon Reflexes: Reflexes normal.  Psychiatric:        Mood and Affect: Mood normal.        Behavior: Behavior normal.        Thought Content: Thought content normal.        Judgment: Judgment normal.     Results for orders placed or performed in visit on 04/04/18  WET PREP FOR TRICH, YEAST, CLUE   Specimen: Vaginal Fluid   VAGINAL FLUI  Result Value Ref Range   Trichomonas Exam Negative Negative   Yeast Exam Negative Negative   Clue Cell Exam Positive (A) Negative  Cytology - PAP  Result Value Ref Range   Adequacy      Satisfactory for evaluation  endocervical/transformation zone component ABSENT.   Diagnosis      NEGATIVE FOR INTRAEPITHELIAL LESIONS OR MALIGNANCY.   HPV NOT DETECTED    Material Submitted CervicoVaginal Pap [ThinPrep Imaged]    CYTOLOGY - PAP PAP RESULT       Assessment & Plan:   Problem List Items Addressed This Visit      Other   Depression, recurrent (HCC)    Under good control on current regimen. Continue current regimen. Continue to monitor. Call with any concerns. Refills given.        PMDD (premenstrual dysphoric disorder)    Under good control on current regimen. Continue current regimen. Continue to monitor. Call with any concerns. Refills  given.         Other Visit Diagnoses    Routine general medical examination at a health care facility    -  Primary   Vaccines up to date. Screening labs checked today. Pap up to date. Mammogram ordered today. Continue diet and exercise. Call with any concerns.    Relevant Orders   CBC with Differential/Platelet   Comprehensive metabolic panel   Lipid Panel w/o Chol/HDL Ratio   Urinalysis, Routine w reflex microscopic   Thyroid Panel With TSH   Routine screening for STI (sexually transmitted infection)       Labs drawn today.  Await results.    Relevant Orders   HIV Antibody (routine testing w rflx)   RPR   GC/Chlamydia Probe Amp   HSV(herpes simplex vrs) 1+2 ab-IgG   Acute Viral Hepatitis (HAV, HBV, HCV)   Encounter for screening mammogram for malignant neoplasm of breast       Mammogram ordered today.   Relevant Orders   MM 3D SCREEN BREAST BILATERAL       Follow up plan: Return in about 6 months (around 05/23/2021).   LABORATORY TESTING:  - Pap smear: up to date  IMMUNIZATIONS:   - Tdap: Tetanus vaccination status reviewed: last tetanus booster within 10 years. - Influenza: Postponed to flu season - Pneumovax: Administered today - COVID vaccine: Up to date  SCREENING: -Mammogram: Up to date  - Colonoscopy: Not applicable   PATIENT COUNSELING:   Advised to take 1 mg of folate supplement per day if capable of pregnancy.   Sexuality: Discussed sexually transmitted diseases, partner selection, use of condoms, avoidance of unintended pregnancy  and contraceptive alternatives.   Advised to avoid cigarette smoking.  I discussed with the patient that most people either abstain from alcohol or drink within safe limits (<=14/week and <=4 drinks/occasion for males, <=7/weeks and <= 3 drinks/occasion for females) and that the risk for alcohol disorders and other health effects rises proportionally with the number of drinks per week and how often a drinker exceeds daily limits.  Discussed cessation/primary prevention of drug use and availability of treatment for abuse.   Diet: Encouraged to adjust caloric intake to maintain  or achieve ideal body weight, to reduce intake of dietary saturated fat and total fat, to limit sodium intake by avoiding high sodium foods and not adding table salt, and to maintain adequate dietary potassium and calcium preferably from fresh fruits, vegetables, and low-fat dairy products.    stressed the importance of regular exercise  Injury prevention: Discussed safety belts,  safety helmets, smoke detector, smoking near bedding or upholstery.   Dental health: Discussed importance of regular tooth brushing, flossing, and dental visits.    NEXT PREVENTATIVE PHYSICAL DUE IN 1 YEAR. Return in about 6 months (around 05/23/2021).

## 2020-11-21 NOTE — Assessment & Plan Note (Signed)
Under good control on current regimen. Continue current regimen. Continue to monitor. Call with any concerns. Refills given.   

## 2020-11-22 LAB — ACUTE VIRAL HEPATITIS (HAV, HBV, HCV)
HCV Ab: <0.1 s/co ratio (ref 0.0–0.9)
Hep A IgM: NEGATIVE
Hep B C IgM: NEGATIVE
Hepatitis B Surface Ag: NEGATIVE

## 2020-11-22 LAB — HCV INTERPRETATION

## 2020-11-23 LAB — LIPID PANEL W/O CHOL/HDL RATIO
Cholesterol, Total: 214 mg/dL — ABNORMAL HIGH (ref 100–199)
HDL: 45 mg/dL (ref >39)
LDL Chol Calc (NIH): 132 mg/dL — ABNORMAL HIGH (ref 0–99)
Triglycerides: 209 mg/dL — ABNORMAL HIGH (ref 0–149)
VLDL Cholesterol Cal: 37 mg/dL (ref 5–40)

## 2020-11-23 LAB — HSV(HERPES SIMPLEX VRS) I + II AB-IGG
HSV 1 Glycoprotein G Ab, IgG: 1.03 index — ABNORMAL HIGH (ref 0.00–0.90)
HSV 2 IgG, Type Spec: <0.91 index (ref 0.00–0.90)

## 2020-11-23 LAB — CBC WITH DIFFERENTIAL/PLATELET
Basophils Absolute: 0.1 10*3/uL (ref 0.0–0.2)
Basos: 1 %
EOS (ABSOLUTE): 0.4 10*3/uL (ref 0.0–0.4)
Eos: 3 %
Hematocrit: 42.4 % (ref 34.0–46.6)
Hemoglobin: 13.8 g/dL (ref 11.1–15.9)
Immature Grans (Abs): 0.1 10*3/uL (ref 0.0–0.1)
Immature Granulocytes: 1 %
Lymphocytes Absolute: 4.2 10*3/uL — ABNORMAL HIGH (ref 0.7–3.1)
Lymphs: 38 %
MCH: 30.9 pg (ref 26.6–33.0)
MCHC: 32.5 g/dL (ref 31.5–35.7)
MCV: 95 fL (ref 79–97)
Monocytes Absolute: 0.7 10*3/uL (ref 0.1–0.9)
Monocytes: 7 %
Neutrophils Absolute: 5.5 10*3/uL (ref 1.4–7.0)
Neutrophils: 50 %
Platelets: 284 10*3/uL (ref 150–450)
RBC: 4.47 x10E6/uL (ref 3.77–5.28)
RDW: 12.7 % (ref 11.7–15.4)
WBC: 10.9 10*3/uL — ABNORMAL HIGH (ref 3.4–10.8)

## 2020-11-23 LAB — COMPREHENSIVE METABOLIC PANEL
ALT: 30 IU/L (ref 0–32)
AST: 26 IU/L (ref 0–40)
Albumin/Globulin Ratio: 1.5 (ref 1.2–2.2)
Albumin: 4 g/dL (ref 3.8–4.8)
Alkaline Phosphatase: 64 IU/L (ref 44–121)
BUN/Creatinine Ratio: 14 (ref 9–23)
BUN: 11 mg/dL (ref 6–24)
Bilirubin Total: 0.4 mg/dL (ref 0.0–1.2)
CO2: 21 mmol/L (ref 20–29)
Calcium: 9.1 mg/dL (ref 8.7–10.2)
Chloride: 102 mmol/L (ref 96–106)
Creatinine, Ser: 0.77 mg/dL (ref 0.57–1.00)
Globulin, Total: 2.6 g/dL (ref 1.5–4.5)
Glucose: 79 mg/dL (ref 65–99)
Potassium: 4 mmol/L (ref 3.5–5.2)
Sodium: 137 mmol/L (ref 134–144)
Total Protein: 6.6 g/dL (ref 6.0–8.5)
eGFR: 100 mL/min/{1.73_m2} (ref >59)

## 2020-11-23 LAB — THYROID PANEL WITH TSH
Free Thyroxine Index: 1.8 (ref 1.2–4.9)
T3 Uptake Ratio: 23 % — ABNORMAL LOW (ref 24–39)
T4, Total: 7.8 ug/dL (ref 4.5–12.0)
TSH: 0.71 u[IU]/mL (ref 0.450–4.500)

## 2020-11-23 LAB — GC/CHLAMYDIA PROBE AMP
Chlamydia trachomatis, NAA: NEGATIVE
Neisseria Gonorrhoeae by PCR: NEGATIVE

## 2020-11-23 LAB — HIV ANTIBODY (ROUTINE TESTING W REFLEX): HIV Screen 4th Generation wRfx: NONREACTIVE

## 2020-11-23 LAB — RPR: RPR Ser Ql: NONREACTIVE

## 2020-11-23 LAB — URINE CULTURE

## 2020-11-25 ENCOUNTER — Other Ambulatory Visit: Payer: Self-pay | Admitting: Family Medicine

## 2020-11-25 MED ORDER — AMOXICILLIN 500 MG PO TABS: 500.0000 mg | ORAL_TABLET | Freq: Two times a day (BID) | ORAL | 0 refills | Status: DC

## 2020-12-09 ENCOUNTER — Encounter: Payer: Self-pay | Admitting: Family Medicine

## 2020-12-09 DIAGNOSIS — Z3009 Encounter for other general counseling and advice on contraception: Secondary | ICD-10-CM

## 2020-12-13 ENCOUNTER — Telehealth: Payer: Self-pay

## 2020-12-13 NOTE — Telephone Encounter (Signed)
[facility] referring for eval for tubal ligation. Called and left voicemail for patient to call back to be scheduled.

## 2020-12-28 ENCOUNTER — Other Ambulatory Visit: Payer: Self-pay | Admitting: Family Medicine

## 2020-12-28 NOTE — Telephone Encounter (Signed)
Pt is scheduled 12/16

## 2020-12-28 NOTE — Telephone Encounter (Signed)
Requested medication (s) are due for refill today: Yes  Requested medication (s) are on the active medication list: Yes  Last refill:  11/02/19  Future visit scheduled: Yes  Notes to clinic:  Unable to refill per protocol, Rx expired.      Requested Prescriptions  Pending Prescriptions Disp Refills   hydrOXYzine (ATARAX/VISTARIL) 25 MG tablet [Pharmacy Med Name: hydrOXYzine HCl 25 MG Oral Tablet] 90 tablet 0    Sig: Take 1 tablet by mouth three times daily as needed      Ear, Nose, and Throat:  Antihistamines Passed - 12/28/2020 12:47 PM      Passed - Valid encounter within last 12 months    Recent Outpatient Visits           1 month ago Routine general medical examination at a health care facility   [facility], [place] P, DO   1 year ago PMDD (premenstrual dysphoric disorder)   [facility] [place], Megan P, DO   1 year ago PMDD (premenstrual dysphoric disorder)   [facility] Johnson, Megan P, DO   1 year ago PMDD (premenstrual dysphoric disorder)   [facility] Johnson, Megan P, DO   1 year ago PMDD (premenstrual dysphoric disorder)   [facility] Johnson, Megan P, DO       Future Appointments             In 4 months Johnson, Oralia Rud, DO [facility], [facility]

## 2020-12-29 ENCOUNTER — Other Ambulatory Visit: Payer: Self-pay

## 2020-12-29 ENCOUNTER — Encounter: Payer: Self-pay | Admitting: Obstetrics and Gynecology

## 2020-12-29 ENCOUNTER — Ambulatory Visit (INDEPENDENT_AMBULATORY_CARE_PROVIDER_SITE_OTHER): Payer: 59 | Admitting: Obstetrics and Gynecology

## 2020-12-29 VITALS — BP 138/72 | Ht 69.0 in | Wt 228.6 lb

## 2020-12-29 DIAGNOSIS — Z3009 Encounter for other general counseling and advice on contraception: Secondary | ICD-10-CM | POA: Diagnosis not present

## 2020-12-29 NOTE — Progress Notes (Signed)
Linda [name] ID: Linda Linda [name], female   DOB: 11/24/1980, 40 y.o.   MRN: [id number]  Reason for Consult: No chief complaint on file.   Referred by Dorcas Carrow, DO  Subjective:     HPI:  Linda Linda [name] is a 40 y.o. female she is here today for consultation regarding sterilization.  She reports that she would like to have her fallopian tubes removed.  She reports that she has never had children and does not desire to have children currently.  She has been using condoms for contraception.  She reports that she has adverse reactions to her hormonal medications.  Gynecological History  Linda [name]'s last menstrual period was 12/28/2020. Menarche: 9  She denies passage of large clots She reports sensations of gushing or flooding of blood. She reports accidents where she bleeds through her clothing. She denies that she changes a saturated pad or tampon more frequently than every hour.  She denies that pain from her periods limits her activities.  History of fibroids, polyps, or ovarian cysts? : no  History of PCOS? yes Hstory of Endometriosis? no History of abnormal pap smears? yes Have you had any sexually transmitted infections in the past? no  Last Pap: Results were: 2019 NIL and HR HPV negative    She identifies as a female. She is sexually active with men and women.   She denies dyspareunia.  She currently uses condoms for contraception.   Obstetrical History G0P0  Past Medical History:  Diagnosis Date   Hyperlipidemia    PCOS (polycystic ovarian syndrome)    Smoker    Thyroid disease    Family History  Problem Relation Age of Onset   Heart failure Maternal Grandmother    Dementia Paternal Grandmother    Past Surgical History:  Procedure Laterality Date   lymph nodes surgery      Short Social History:  Social History   Tobacco Use   Smoking status: Every Day    Packs/day: 1.00    Types: Cigarettes   Smokeless tobacco: Never  Substance Use Topics   Alcohol  use: Yes    Alcohol/week: 2.0 standard drinks    Types: 2 Glasses of wine per week    No Known Allergies  Current Outpatient Medications  Medication Sig Dispense Refill   buPROPion (WELLBUTRIN XL) 300 MG 24 hr tablet Take 1 tablet by mouth once daily 90 tablet 0   Calcium Carbonate-Vitamin D (CALCIUM-D PO) Take by mouth daily.     hydrOXYzine (ATARAX/VISTARIL) 25 MG tablet Take 1 tablet by mouth three times daily as needed 90 tablet 0   Multiple Vitamins-Minerals (MULTIVITAMIN PO) Take by mouth daily.     VITAMIN D PO Take by mouth 2 (two) times daily.     amoxicillin (AMOXIL) 500 MG tablet Take 1 tablet (500 mg total) by mouth 2 (two) times daily. (Linda [name] not taking: Reported on 12/29/2020) 14 tablet 0   No current facility-administered medications for this visit.    Review of Systems  Constitutional: Negative for chills, fatigue, fever and unexpected weight change.  HENT: Negative for trouble swallowing.  Eyes: Negative for loss of vision.  Respiratory: Negative for cough, shortness of breath and wheezing.  Cardiovascular: Negative for chest pain, leg swelling, palpitations and syncope.  GI: Negative for abdominal pain, blood in stool, diarrhea, nausea and vomiting.  GU: Negative for difficulty urinating, dysuria, frequency and hematuria.  Musculoskeletal: Negative for back pain, leg pain and joint pain.  Skin: Negative for rash.  Neurological: Negative for dizziness, headaches, light-headedness, numbness and seizures.  Psychiatric: Negative for behavioral problem, confusion, depressed mood and sleep disturbance.       Objective:  Objective   Vitals:   12/29/20 1433  BP: 138/72  Weight: 228 lb 9.6 oz (103.7 kg)  Height: 5\' 9"  (1.753 m)   Body mass index is 33.76 kg/m.  Physical Exam Vitals and nursing note reviewed. Exam conducted with a chaperone present.  Constitutional:      Appearance: Normal appearance.  HENT:     Head: Normocephalic and atraumatic.  Eyes:      Extraocular Movements: Extraocular movements intact.     Pupils: Pupils are equal, round, and reactive to light.  Cardiovascular:     Rate and Rhythm: Normal rate and regular rhythm.  Pulmonary:     Effort: Pulmonary effort is normal.     Breath sounds: Normal breath sounds.  Abdominal:     General: Abdomen is flat.     Palpations: Abdomen is soft.  Musculoskeletal:     Cervical back: Normal range of motion.  Skin:    General: Skin is warm and dry.  Neurological:     General: No focal deficit present.     Mental Status: She is alert and oriented to person, place, and time.  Psychiatric:        Behavior: Behavior normal.        Thought Content: Thought content normal.        Judgment: Judgment normal.    Assessment/Plan:     40 y.o. G0P0  Linda [name] reports that she desires sterilization. She feels strongly that she does not desire children in the future.   We discussed alternative options for sterilization including long-acting reversible contraception methods. We discussed that sterilization procedures have a failure rate of approximately 1 in  1000.  We discussed potential surgical complications of sterile sterilization including infection damage to surrounding pelvic tissues and risk of bleeding.We discussed that sterilization procedure should be considered non-reversible.  Having a reversal surgery to correct a tubal ligation is expensive, risks and ectopic pregnancy, and has high failure rates.  We discussed options of performing a tubal ligation including removing a segment of the fallopian tube or removing an entire fallopian tube.  We discussed that removal of entire fallopian tube has been associated with a reduction in ovarian cancer risk, however this reduction in is small in her lifetime risk of ovarian cancer is approximately 1 in 100.   Linda [name] feels sure of her decision to have a sterilization procedure.  She desires to have a laparoscopic bilateral salpingectomy.  Note  sent to surgical scheduler who will reach out to the Linda [name] to schedule surgery.  More than 20 minutes were spent face to face with the Linda [name] in the room, reviewing the medical record, labs and images, and coordinating care for the Linda [name]. The plan of management was discussed in detail and counseling was provided.    41 MD, [facility], [facility] 12/29/2020 3:06 PM

## 2020-12-29 NOTE — Patient Instructions (Signed)
Salpingectomy Salpingectomy, also called tubectomy, is the surgical removal of one of the fallopian tubes. The fallopian tubes allow eggs to travel from the ovaries to the uterus. Removing one fallopian tube does not prevent pregnancy. It also does not cause problems with your menstrual periods. You may need this procedure if you: Have an ectopic pregnancy. This is when a fertilized egg attaches to the fallopian tube instead of the uterus. An ectopic pregnancy can cause the tube to burst or tear (rupture). Have an infected fallopian tube. Have cancer of the fallopian tube or nearby organs. Have had an ovary removed due to a cyst or tumor. Have had your uterus removed. Are at high risk for ovarian cancer. There are three different methods that can be used for a salpingectomy: An open method in which one large incision is made in your abdomen. A laparoscopic method in which a thin, lighted tube with a tiny camera (laparoscope) is used to help perform the procedure. The laparoscope allows a surgeon to make several small incisions in the abdomen instead of one large incision. A robot-assisted method in which a computer is used to control surgical instruments that are attached to robotic arms. Tell a health care provider about: Any allergies you have. All medicines you are taking, including vitamins, herbs, eye drops, creams, and over-the-counter medicines. Any problems you or family members have had with anesthetic medicines. Any blood disorders you have. Any surgeries you have had. Any medical conditions you have. Whether you are pregnant or may be pregnant. What are the risks? Generally, this is a safe procedure. However, problems may occur, including: Infection. Bleeding. Allergic reactions to medicines. Blood clots in the legs or lungs. Damage to nearby structures or organs. What happens before the procedure? Staying hydrated Follow instructions from your health care provider about  hydration, which may include: Up to 2 hours before the procedure - you may continue to drink clear liquids, such as water, clear fruit juice, black coffee, and plain tea. Eating and drinking restrictions Follow instructions from your health care provider about eating and drinking, which may include: 8 hours before the procedure - stop eating heavy meals or foods, such as meat, fried foods, or fatty foods. 6 hours before the procedure - stop eating light meals or foods, such as toast or cereal. 6 hours before the procedure - stop drinking milk or drinks that contain milk. 2 hours before the procedure - stop drinking clear liquids. Medicines Ask your health care provider about: Changing or stopping your regular medicines. This is especially important if you are taking diabetes medicines or blood thinners. Taking medicines such as aspirin and ibuprofen. These medicines can thin your blood. Do not take these medicines unless your health care provider tells you to take them. Taking over-the-counter medicines, vitamins, herbs, and supplements. General instructions Do not use any products that contain nicotine or tobacco for at least 4 weeks before the procedure. These products include cigarettes, chewing tobacco, and vaping devices, such as e-cigarettes. If you need help quitting, ask your health care provider. You may have an exam or tests, such as an electrocardiogram (ECG) or a blood or urine test. Ask your health care provider: How your surgery site will be marked. What steps will be taken to help prevent infection. These steps may include: Removing hair at the surgery site. Washing skin with a germ-killing soap. Taking antibiotic medicine. Plan to have a responsible adult take you home from the hospital or clinic. If you will be  site will be marked.  What steps will be taken to help prevent infection. These steps may include:  Removing hair at the surgery site.  Washing skin with a germ-killing soap.  Taking antibiotic medicine.  Plan to have a responsible adult take you home from the hospital or clinic.  If you will be going home right after the procedure, plan to have a responsible adult care for you for the time you are told. This is important.  What happens during the  procedure?  An IV will be inserted into one of your veins.  You will be given one or both of the following:  A medicine to help you relax (sedative).  A medicine to make you fall asleep (general anesthetic).  A small, thin tube (catheter) may be inserted through your urethra and into your bladder. This will drain urine during your procedure.  Depending on the type of procedure you are having, one incision or several small incisions will be made in your abdomen.  Your fallopian tube and ovary will be cut away from the uterus and removed.  Your blood vessels will be clamped and tied to prevent excess bleeding.  The incision or incisions in your abdomen will be closed with stitches (sutures), staples, or skin glue.  A bandage (dressing) may be placed over your incision or incisions.  The procedure may vary among health care providers and hospitals.  What happens after the procedure?    Your blood pressure, heart rate, breathing rate, and blood oxygen level will be monitored until you leave the hospital or clinic.  You may continue to receive fluids and medicines through an IV.  You may continue to have a catheter draining your urine.  You may have to wear compression stockings. These stockings help to prevent blood clots and reduce swelling in your legs.  You will be given pain medicine as needed.  If you were given a sedative during the procedure, it can affect you for several hours. Do not drive or operate machinery until your health care provider says that it is safe.  Summary  Salpingectomy is a surgical procedure to remove one of the fallopian tubes.  The procedure may be done with an open incision, a thin, lighted tube with a tiny camera (laparoscope), or computer-controlled instruments.  Depending on the type of procedure you have, one incision or several small incisions will be made in your abdomen.  Your blood pressure, heart rate, breathing rate, and blood oxygen level will be monitored until you leave the  hospital or clinic.  Plan to have a responsible adult take you home from the hospital or clinic.  This information is not intended to replace advice given to you by your health care provider. Make sure you discuss any questions you have with your health care provider.  Document Revised: 04/26/2020 Document Reviewed: 04/26/2020  Elsevier Patient Education  2022 [employer]

## 2021-01-02 ENCOUNTER — Telehealth: Payer: Self-pay

## 2021-01-02 NOTE — Telephone Encounter (Signed)
-----   Message from Natale Milch, MD sent at 12/29/2020  3:08 PM EDT ----- Regarding: Surgery Surgery Booking Request Patient Full Name:  Linda [name]  MRN: [id number]  DOB: [birth year]  Surgeon: Natale Milch, MD  Requested Surgery Date and Time: At patient preference Primary Diagnosis AND Code: Desires sterilization Secondary Diagnosis and Code:  Surgical Procedure: Robot assisted bilateral salpingectomy RNFA Requested?: No L&D Notification: No Admission Status: same day surgery Length of Surgery: 50 min Special Case Needs: No H&P: No Phone Interview???:  Yes Interpreter: No Medical Clearance:  No Special Scheduling Instructions: No Any known health/anesthesia issues, diabetes, sleep apnea, latex allergy, defibrillator/pacemaker?: No Acuity: P3   (P1 highest, P2 delay may cause harm, P3 low, elective gyn, P4 lowest) Post op follow up visits: 1 week after surgery

## 2021-01-02 NOTE — Telephone Encounter (Signed)
Called patient to schedule robot assisted bilateral salpingectomy w [facility]  DOS 8/4  H&P  n/a    Pre-admit phone call appointment to be requested - date and time will be included on H&P paper work. Also all appointments will be updated on pt MyChart. Explained that this appointment has a call window. Based on the time scheduled will indicate if the call will be received within a 4 hour window before 1:00 or after.  Advised that pt may also receive calls from the hospital pharmacy and pre-service center.  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 No secondary insurance.

## 2021-01-03 NOTE — Telephone Encounter (Signed)
Orders placed.

## 2021-01-12 ENCOUNTER — Other Ambulatory Visit: Payer: Self-pay

## 2021-01-12 ENCOUNTER — Encounter
Admission: RE | Admit: 2021-01-12 | Discharge: 2021-01-12 | Disposition: A | Discharge status: Home / Self Care | Payer: 59 | Source: Ambulatory Visit | Attending: Obstetrics and Gynecology | Admitting: Obstetrics and Gynecology

## 2021-01-12 NOTE — Patient Instructions (Signed)
Your procedure is scheduled on: 01/19/21 Report to DAY SURGERY DEPARTMENT LOCATED ON 2ND FLOOR MEDICAL MALL ENTRANCE. To find out your arrival time please call [phone] between 1PM - 3PM on 01/18/21.  Remember: Instructions that are not followed completely may result in serious medical risk, up to and including death, or upon the discretion of your surgeon and anesthesiologist your surgery may need to be rescheduled.     _X__ 1. Do not eat food after midnight the night before your procedure.                 No gum chewing or hard candies. You may drink clear liquids up to 2 hours                 before you are scheduled to arrive for your surgery- DO not drink clear                 liquids within 2 hours of the start of your surgery.                 Clear Liquids include:  water, apple juice without pulp, clear carbohydrate                 drink such as Clearfast or Gatorade, Black Coffee or Tea (Do not add                 anything to coffee or tea). Diabetics water only  __X__2.  On the morning of surgery brush your teeth with toothpaste and water, you                 may rinse your mouth with mouthwash if you wish.  Do not swallow any              toothpaste of mouthwash.     _X__ 3.  No Alcohol for 24 hours before or after surgery.   _X__ 4.  Do Not Smoke or use e-cigarettes For 24 Hours Prior to Your Surgery.                 Do not use any chewable tobacco products for at least 6 hours prior to                 surgery.  ____  5.  Bring all medications with you on the day of surgery if instructed.   __X__  6.  Notify your doctor if there is any change in your medical condition      (cold, fever, infections).     Do not wear jewelry, make-up or eye make-up, hairpins, clips or nail polish. Do not wear lotions, powders, or perfumes. Shower with Dial soap the night before and the morning of surgery Do not shave body hair 48 hours prior to surgery. Men may shave face and neck. Do not  bring valuables to the hospital.    [facility] is not responsible for any belongings or valuables.  Contacts, dentures/partials or body piercings may not be worn into surgery. Bring a case for your contacts, glasses or hearing aids, a denture cup will be supplied. Leave your suitcase in the car. After surgery it may be brought to your room. For patients admitted to the hospital, discharge time is determined by your treatment team.   Patients discharged the day of surgery will not be allowed to drive home. Wear loose fitting clothing   Please read over the following fact sheets that you were given:  __X__ Take these medicines the morning of surgery with A SIP OF WATER:    1. buPROPion (WELLBUTRIN XL) 300 MG 24 hr tablet  2. hydrOXYzine (ATARAX/VISTARIL) 25 MG tablet may take if needed  3.   4.  5.  6.  ____ Fleet Enema (as directed)   ____ Use CHG Soap/SAGE wipes as directed  ____ Use inhalers on the day of surgery  ____ Stop metformin/Janumet/Farxiga 2 days prior to surgery    ____ Take 1/2 of usual insulin dose the night before surgery. No insulin the morning          of surgery.   ____ Stop Blood Thinners Coumadin/Plavix/Xarelto/Pleta/Pradaxa/Eliquis/Effient/Aspirin  on   Or contact your Surgeon, Cardiologist or Medical Doctor regarding  ability to stop your blood thinners  __X__ Stop Anti-inflammatories 7 days before surgery such as Advil, Ibuprofen, Motrin,  BC or Goodies Powder, Naprosyn, Naproxen, Aleve, Aspirin   You may take Tylenol if needed  __X__ Stop all herbal supplements, fish oil or vitamin E until after surgery.    ____ Bring C-Pap to the hospital.    Verbal instructions via phone provided. Patient verbalized understanding and aware instructions available in MyChart as After Visit Summary (AVS) for this visit. May call our office if needed at [phone]

## 2021-01-18 NOTE — Telephone Encounter (Signed)
Contacted patient to confirm that she tested positive for Covid per msg left with OR.  Patient did a home test on Monday, 01/16/21, that was positive. I advised that she needed a lab test done for documentation. She is planning to go to [facility] today.   We discussed the 10 day wait after positive test and looked at [facility] OR schedule to see when I could re-schedule her for surgery. We agreed to 02/02/21.

## 2021-02-02 ENCOUNTER — Ambulatory Visit
Admission: RE | Admit: 2021-02-02 | Discharge: 2021-02-02 | Disposition: A | Discharge status: Home / Self Care | Payer: 59 | Attending: Obstetrics and Gynecology | Admitting: Obstetrics and Gynecology

## 2021-02-02 ENCOUNTER — Encounter
Admission: RE | Disposition: A | Discharge status: Home / Self Care | Payer: Self-pay | Source: Home / Self Care | Attending: Obstetrics and Gynecology

## 2021-02-02 ENCOUNTER — Ambulatory Visit: Payer: 59 | Admitting: Anesthesiology

## 2021-02-02 ENCOUNTER — Other Ambulatory Visit: Payer: Self-pay

## 2021-02-02 ENCOUNTER — Encounter: Payer: Self-pay | Admitting: Obstetrics and Gynecology

## 2021-02-02 DIAGNOSIS — F17210 Nicotine dependence, cigarettes, uncomplicated: Secondary | ICD-10-CM | POA: Insufficient documentation

## 2021-02-02 DIAGNOSIS — Z302 Encounter for sterilization: Secondary | ICD-10-CM | POA: Insufficient documentation

## 2021-02-02 HISTORY — PX: XI ROBOTIC ASSISTED SALPINGECTOMY: SHX6824

## 2021-02-02 LAB — CBC
HCT: 40.3 % (ref 36.0–46.0)
Hemoglobin: 14.4 g/dL (ref 12.0–15.0)
MCH: 32.7 pg (ref 26.0–34.0)
MCHC: 35.7 g/dL (ref 30.0–36.0)
MCV: 91.4 fL (ref 80.0–100.0)
Platelets: 278 10*3/uL (ref 150–400)
RBC: 4.41 MIL/uL (ref 3.87–5.11)
RDW: 13.2 % (ref 11.5–15.5)
WBC: 9.9 10*3/uL (ref 4.0–10.5)
nRBC: 0 % (ref 0.0–0.2)

## 2021-02-02 LAB — TYPE AND SCREEN
ABO/RH(D): A POS
Antibody Screen: NEGATIVE

## 2021-02-02 LAB — POCT PREGNANCY, URINE: Preg Test, Ur: NEGATIVE

## 2021-02-02 LAB — ABO/RH: ABO/RH(D): A POS

## 2021-02-02 SURGERY — SALPINGECTOMY, ROBOT-ASSISTED
Anesthesia: General | Laterality: Bilateral

## 2021-02-02 MED ORDER — ACETAMINOPHEN 500 MG PO TABS: 1000.0000 mg | ORAL_TABLET | Freq: Four times a day (QID) | ORAL | 0 refills | Status: AC | PRN

## 2021-02-02 MED ORDER — ORAL CARE MOUTH RINSE: 15.0000 mL | Freq: Once | OROMUCOSAL | Status: AC

## 2021-02-02 MED ORDER — BUPIVACAINE LIPOSOME 1.3 % IJ SUSP
INTRAMUSCULAR | Status: AC
  Filled 2021-02-02: qty 20

## 2021-02-02 MED ORDER — MIDAZOLAM HCL 2 MG/2ML IJ SOLN
INTRAMUSCULAR | Status: AC
  Filled 2021-02-02: qty 2

## 2021-02-02 MED ORDER — FENTANYL CITRATE (PF) 100 MCG/2ML IJ SOLN
INTRAMUSCULAR | Status: DC | PRN
  Administered 2021-02-02: 100 ug via INTRAVENOUS

## 2021-02-02 MED ORDER — FAMOTIDINE 20 MG PO TABS: 20.0000 mg | ORAL_TABLET | Freq: Once | ORAL | Status: AC

## 2021-02-02 MED ORDER — FENTANYL CITRATE (PF) 100 MCG/2ML IJ SOLN
25.0000 ug | INTRAMUSCULAR | Status: DC | PRN
  Administered 2021-02-02: 25 ug via INTRAVENOUS

## 2021-02-02 MED ORDER — CHLORHEXIDINE GLUCONATE 0.12 % MT SOLN: 15.0000 mL | Freq: Once | OROMUCOSAL | Status: AC

## 2021-02-02 MED ORDER — LIDOCAINE HCL (PF) 2 % IJ SOLN
INTRAMUSCULAR | Status: AC
  Filled 2021-02-02: qty 5

## 2021-02-02 MED ORDER — OXYCODONE HCL 5 MG PO TABS: 5.0000 mg | ORAL_TABLET | Freq: Three times a day (TID) | ORAL | 0 refills | Status: DC | PRN

## 2021-02-02 MED ORDER — FENTANYL CITRATE (PF) 100 MCG/2ML IJ SOLN
INTRAMUSCULAR | Status: AC
  Filled 2021-02-02: qty 2

## 2021-02-02 MED ORDER — LABETALOL HCL 5 MG/ML IV SOLN
INTRAVENOUS | Status: AC
  Filled 2021-02-02: qty 4

## 2021-02-02 MED ORDER — ONDANSETRON HCL 4 MG/2ML IJ SOLN
INTRAMUSCULAR | Status: AC
  Filled 2021-02-02: qty 2

## 2021-02-02 MED ORDER — DEXAMETHASONE SODIUM PHOSPHATE 10 MG/ML IJ SOLN
INTRAMUSCULAR | Status: DC | PRN
  Administered 2021-02-02: 10 mg via INTRAVENOUS

## 2021-02-02 MED ORDER — LACTATED RINGERS IV SOLN: INTRAVENOUS | Status: DC

## 2021-02-02 MED ORDER — MIDAZOLAM HCL 2 MG/2ML IJ SOLN
INTRAMUSCULAR | Status: DC | PRN
  Administered 2021-02-02: 2 mg via INTRAVENOUS

## 2021-02-02 MED ORDER — FAMOTIDINE 20 MG PO TABS
ORAL_TABLET | ORAL | Status: AC
  Administered 2021-02-02: 20 mg via ORAL
  Filled 2021-02-02: qty 1

## 2021-02-02 MED ORDER — SUGAMMADEX SODIUM 200 MG/2ML IV SOLN
INTRAVENOUS | Status: DC | PRN
Start: 2021-02-02 — End: 2021-02-02
  Administered 2021-02-02: 200 mg via INTRAVENOUS

## 2021-02-02 MED ORDER — ROCURONIUM BROMIDE 100 MG/10ML IV SOLN
INTRAVENOUS | Status: DC | PRN
  Administered 2021-02-02: 50 mg via INTRAVENOUS

## 2021-02-02 MED ORDER — OXYCODONE HCL 5 MG/5ML PO SOLN
5.0000 mg | Freq: Once | ORAL | Status: DC | PRN
Start: 2021-02-02 — End: 2021-02-02

## 2021-02-02 MED ORDER — CHLORHEXIDINE GLUCONATE 0.12 % MT SOLN
OROMUCOSAL | Status: AC
  Administered 2021-02-02: 15 mL via OROMUCOSAL
  Filled 2021-02-02: qty 15

## 2021-02-02 MED ORDER — DEXMEDETOMIDINE (PRECEDEX) IN NS 20 MCG/5ML (4 MCG/ML) IV SYRINGE
PREFILLED_SYRINGE | INTRAVENOUS | Status: AC
  Filled 2021-02-02: qty 5

## 2021-02-02 MED ORDER — OXYCODONE HCL 5 MG PO TABS: 5.0000 mg | ORAL_TABLET | Freq: Once | ORAL | Status: DC | PRN

## 2021-02-02 MED ORDER — BUPIVACAINE LIPOSOME 1.3 % IJ SUSP
INTRAMUSCULAR | Status: DC | PRN
  Administered 2021-02-02: 20 mL

## 2021-02-02 MED ORDER — IBUPROFEN 600 MG PO TABS: 600.0000 mg | ORAL_TABLET | Freq: Four times a day (QID) | ORAL | 0 refills | Status: DC | PRN

## 2021-02-02 MED ORDER — DEXMEDETOMIDINE (PRECEDEX) IN NS 20 MCG/5ML (4 MCG/ML) IV SYRINGE
PREFILLED_SYRINGE | INTRAVENOUS | Status: DC | PRN
  Administered 2021-02-02: 8 ug via INTRAVENOUS
  Administered 2021-02-02: 4 ug via INTRAVENOUS
  Administered 2021-02-02: 8 ug via INTRAVENOUS

## 2021-02-02 MED ORDER — 0.9 % SODIUM CHLORIDE (POUR BTL) OPTIME
TOPICAL | Status: DC | PRN
  Administered 2021-02-02: 200 mL

## 2021-02-02 MED ORDER — POVIDONE-IODINE 10 % EX SWAB: 2.0000 "application " | Freq: Once | CUTANEOUS | Status: DC

## 2021-02-02 MED ORDER — PROPOFOL 10 MG/ML IV BOLUS
INTRAVENOUS | Status: DC | PRN
  Administered 2021-02-02: 150 mg via INTRAVENOUS
  Administered 2021-02-02: 50 mg via INTRAVENOUS

## 2021-02-02 MED ORDER — KETOROLAC TROMETHAMINE 30 MG/ML IJ SOLN
INTRAMUSCULAR | Status: AC
  Filled 2021-02-02: qty 1

## 2021-02-02 MED ORDER — KETOROLAC TROMETHAMINE 30 MG/ML IJ SOLN
INTRAMUSCULAR | Status: DC | PRN
  Administered 2021-02-02: 30 mg via INTRAVENOUS

## 2021-02-02 MED ORDER — LABETALOL HCL 5 MG/ML IV SOLN
INTRAVENOUS | Status: DC | PRN
  Administered 2021-02-02: 5 mg via INTRAVENOUS

## 2021-02-02 MED ORDER — ROCURONIUM BROMIDE 10 MG/ML (PF) SYRINGE
PREFILLED_SYRINGE | INTRAVENOUS | Status: AC
  Filled 2021-02-02: qty 10

## 2021-02-02 MED ORDER — PROPOFOL 10 MG/ML IV BOLUS
INTRAVENOUS | Status: AC
  Filled 2021-02-02: qty 20

## 2021-02-02 MED ORDER — LIDOCAINE HCL (CARDIAC) PF 100 MG/5ML IV SOSY
PREFILLED_SYRINGE | INTRAVENOUS | Status: DC | PRN
  Administered 2021-02-02: 60 mg via INTRAVENOUS

## 2021-02-02 MED ORDER — ONDANSETRON HCL 4 MG/2ML IJ SOLN
INTRAMUSCULAR | Status: DC | PRN
  Administered 2021-02-02: 4 mg via INTRAVENOUS

## 2021-02-02 SURGICAL SUPPLY — 49 items
ADH SKN CLS APL DERMABOND .7 (GAUZE/BANDAGES/DRESSINGS) ×1
APL PRP STRL LF DISP 70% ISPRP (MISCELLANEOUS) ×1
BACTOSHIELD CHG 4% 4OZ (MISCELLANEOUS) ×1
BLADE SURG SZ11 CARB STEEL (BLADE) ×2 IMPLANT
CATH ROBINSON RED A/P 16FR (CATHETERS) ×2 IMPLANT
CHLORAPREP W/TINT 26 (MISCELLANEOUS) ×2 IMPLANT
COVER MAYO STAND REUSABLE (DRAPES) ×2 IMPLANT
DEFOGGER SCOPE WARMER CLEARIFY (MISCELLANEOUS) ×2 IMPLANT
DERMABOND ADVANCED (GAUZE/BANDAGES/DRESSINGS) ×1
DERMABOND ADVANCED .7 DNX12 (GAUZE/BANDAGES/DRESSINGS) ×1 IMPLANT
DRAPE ARM DVNC X/XI (DISPOSABLE) ×3 IMPLANT
DRAPE COLUMN DVNC XI (DISPOSABLE) ×1 IMPLANT
DRAPE DA VINCI XI ARM (DISPOSABLE) ×6
DRAPE DA VINCI XI COLUMN (DISPOSABLE) ×1
DRAPE ROBOT W/ LEGGING 30X125 (DRAPES) ×2 IMPLANT
DRSG TEGADERM 2-3/8X2-3/4 SM (GAUZE/BANDAGES/DRESSINGS) ×6 IMPLANT
ELECT REM PT RETURN 9FT ADLT (ELECTROSURGICAL) ×2
ELECTRODE REM PT RTRN 9FT ADLT (ELECTROSURGICAL) ×1 IMPLANT
GAUZE 4X4 16PLY ~~LOC~~+RFID DBL (SPONGE) ×2 IMPLANT
GLOVE SURG ENC MOIS LTX SZ7 (GLOVE) ×4 IMPLANT
GLOVE SURG UNDER POLY LF SZ7.5 (GLOVE) ×4 IMPLANT
GOWN STRL REUS W/ TWL LRG LVL3 (GOWN DISPOSABLE) ×4 IMPLANT
GOWN STRL REUS W/TWL LRG LVL3 (GOWN DISPOSABLE) ×8
GRASPER SUT TROCAR 14GX15 (MISCELLANEOUS) ×2 IMPLANT
KIT PINK PAD W/HEAD ARE REST (MISCELLANEOUS) ×2
KIT PINK PAD W/HEAD ARM REST (MISCELLANEOUS) ×1 IMPLANT
MANIFOLD NEPTUNE II (INSTRUMENTS) ×2 IMPLANT
MANIPULATOR UTERINE 4.5 ZUMI (MISCELLANEOUS) ×2 IMPLANT
NEEDLE HYPO 22GX1.5 SAFETY (NEEDLE) ×2 IMPLANT
NS IRRIG 1000ML POUR BTL (IV SOLUTION) ×2 IMPLANT
OBTURATOR OPTICAL STANDARD 8MM (TROCAR) ×2
OBTURATOR OPTICAL STND 8 DVNC (TROCAR) ×1
OBTURATOR OPTICALSTD 8 DVNC (TROCAR) ×1 IMPLANT
PACK GYN LAPAROSCOPIC (MISCELLANEOUS) ×2 IMPLANT
PAD ARMBOARD 7.5X6 YLW CONV (MISCELLANEOUS) ×2 IMPLANT
PAD OB MATERNITY 4.3X12.25 (PERSONAL CARE ITEMS) ×2 IMPLANT
PAD PREP 24X41 OB/GYN DISP (PERSONAL CARE ITEMS) ×2 IMPLANT
SCRUB CHG 4% DYNA-HEX 4OZ (MISCELLANEOUS) ×1 IMPLANT
SEAL CANN UNIV 5-8 DVNC XI (MISCELLANEOUS) ×3 IMPLANT
SEAL XI 5MM-8MM UNIVERSAL (MISCELLANEOUS) ×6
SEALER VESSEL DA VINCI XI (MISCELLANEOUS) ×1
SEALER VESSEL EXT DVNC XI (MISCELLANEOUS) ×1 IMPLANT
SET TUBE SMOKE EVAC HIGH FLOW (TUBING) ×2 IMPLANT
SUT MNCRL 4-0 (SUTURE) ×2
SUT MNCRL 4-0 27XMFL (SUTURE) ×1
SUT VIC AB 0 CT1 36 (SUTURE) ×2 IMPLANT
SUTURE MNCRL 4-0 27XMF (SUTURE) ×1 IMPLANT
SYR 10ML LL (SYRINGE) ×2 IMPLANT
TAPE TRANSPORE STRL 2 31045 (GAUZE/BANDAGES/DRESSINGS) ×2 IMPLANT

## 2021-02-02 NOTE — Anesthesia Preprocedure Evaluation (Signed)
Anesthesia Evaluation  Patient identified by MRN, date of birth, ID band Patient awake    Reviewed: Allergy & Precautions, NPO status , Patient's Chart, lab work & pertinent test results  History of Anesthesia Complications Negative for: history of anesthetic complications  Airway Mallampati: III  TM Distance: >3 FB Neck ROM: full    Dental  (+) Chipped, Poor Dentition   Pulmonary neg shortness of breath, Current Smoker and Patient abstained from smoking.,    Pulmonary exam normal        Cardiovascular Exercise Tolerance: Good (-) angina(-) Past MI Normal cardiovascular exam+ dysrhythmias      Neuro/Psych PSYCHIATRIC DISORDERS negative neurological ROS     GI/Hepatic negative GI ROS, Neg liver ROS, neg GERD  ,  Endo/Other  negative endocrine ROS  Renal/GU      Musculoskeletal   Abdominal   Peds  Hematology negative hematology ROS (+)   Anesthesia Other Findings Past Medical History: No date: Hyperlipidemia No date: PCOS (polycystic ovarian syndrome) No date: Smoker No date: Thyroid disease  Past Surgical History: No date: LEEP No date: lymph nodes surgery  BMI    Body Mass Index: 33.23 kg/m      Reproductive/Obstetrics negative OB ROS                             Anesthesia Physical Anesthesia Plan  ASA: 3  Anesthesia Plan: General ETT   Post-op Pain Management:    Induction: Intravenous  PONV Risk Score and Plan: Ondansetron, Dexamethasone, Midazolam and Treatment may vary due to age or medical condition  Airway Management Planned: Oral ETT  Additional Equipment:   Intra-op Plan:   Post-operative Plan: Extubation in OR  Informed Consent: I have reviewed the patients History and Physical, chart, labs and discussed the procedure including the risks, benefits and alternatives for the proposed anesthesia with the patient or authorized representative who has indicated  his/her understanding and acceptance.     Dental Advisory Given  Plan Discussed with: Anesthesiologist, CRNA and Surgeon  Anesthesia Plan Comments: (Patient consented for risks of anesthesia including but not limited to:  - adverse reactions to medications - damage to eyes, teeth, lips or other oral mucosa - nerve damage due to positioning  - sore throat or hoarseness - Damage to heart, brain, nerves, lungs, other parts of body or loss of life  Patient voiced understanding.)        Anesthesia Quick Evaluation

## 2021-02-02 NOTE — Anesthesia Procedure Notes (Signed)
Procedure Name: Intubation Date/Time: 02/02/2021 1:32 PM Performed by: Joanette Gula, Cristi Gwynn, CRNA Pre-anesthesia Checklist: Patient identified, Emergency Drugs available, Suction available and Patient being monitored Patient Re-evaluated:Patient Re-evaluated prior to induction Oxygen Delivery Method: Circle system utilized Preoxygenation: Pre-oxygenation with 100% oxygen Induction Type: IV induction Ventilation: Mask ventilation without difficulty Laryngoscope Size: McGraph and 3 Grade View: Grade I Tube type: Oral Number of attempts: 1 Airway Equipment and Method: Stylet Placement Confirmation: ETT inserted through vocal cords under direct vision, positive ETCO2 and breath sounds checked- equal and bilateral Secured at: 21 cm Tube secured with: Tape Dental Injury: Teeth and Oropharynx as per pre-operative assessment

## 2021-02-02 NOTE — Transfer of Care (Signed)
Immediate Anesthesia Transfer of Care Note  Patient: Linda [name]  Procedure(s) Performed: XI ROBOTIC ASSISTED SALPINGECTOMY (Bilateral)  Patient Location: PACU  Anesthesia Type:General  Level of Consciousness: drowsy and patient cooperative  Airway & Oxygen Therapy: Patient Spontanous Breathing and Patient connected to nasal cannula oxygen  Post-op Assessment: Report given to RN and Post -op Vital signs reviewed and stable  Post vital signs: Reviewed and stable  Last Vitals:  Vitals Value Taken Time  BP 128/73 02/02/21 1459  Temp    Pulse 63 02/02/21 1459  Resp 17 02/02/21 1459  SpO2 97 % 02/02/21 1459  Vitals shown include unvalidated device data.  Last Pain:  Vitals:   02/02/21 1154  TempSrc: Temporal  PainSc: 0-No pain         Complications: No notable events documented.

## 2021-02-02 NOTE — H&P (Signed)
Linda [name] is an 40 y.o. female.   Chief Complaint: Desires sterilization HPI: She is here for permanent sterilization by bilateral salpingectomy.   Past Medical History:  Diagnosis Date   Hyperlipidemia    PCOS (polycystic ovarian syndrome)    Smoker    Thyroid disease     Past Surgical History:  Procedure Laterality Date   LEEP     lymph nodes surgery      Family History  Problem Relation Age of Onset   Heart failure Maternal Grandmother    Dementia Paternal Grandmother    Social History:  reports that she has been smoking cigarettes. She has been smoking an average of 1 pack per day. She has never used smokeless tobacco. She reports current alcohol use of about 2.0 standard drinks per week. She reports that she does not use drugs.  Allergies: No Known Allergies  Medications Prior to Admission  Medication Sig Dispense Refill   buPROPion (WELLBUTRIN XL) 300 MG 24 hr tablet Take 1 tablet by mouth once daily (Patient taking differently: Take 300 mg by mouth daily.) 90 tablet 0   hydrOXYzine (ATARAX/VISTARIL) 25 MG tablet Take 1 tablet by mouth three times daily as needed (Patient taking differently: Take 25 mg by mouth 3 (three) times daily as needed for anxiety.) 90 tablet 0   Multiple Vitamins-Minerals (MULTIVITAMIN PO) Take 1 tablet by mouth daily.     VITAMIN D PO Take 1 tablet by mouth daily.      Results for orders placed or performed during the hospital encounter of 02/02/21 (from the past 48 hour(s))  Pregnancy, urine POC     Status: None   Collection Time: 02/02/21 11:30 AM  Result Value Ref Range   Preg Test, Ur NEGATIVE NEGATIVE    Comment:        THE SENSITIVITY OF THIS METHODOLOGY IS >24 mIU/mL   CBC     Status: None   Collection Time: 02/02/21 11:51 AM  Result Value Ref Range   WBC 9.9 4.0 - 10.5 K/uL   RBC 4.41 3.87 - 5.11 MIL/uL   Hemoglobin 14.4 12.0 - 15.0 g/dL   HCT 24.2 68.3 - 41.9 %   MCV 91.4 80.0 - 100.0 fL   MCH 32.7 26.0 - 34.0 pg   MCHC  35.7 30.0 - 36.0 g/dL   RDW 62.2 29.7 - 98.9 %   Platelets 278 150 - 400 K/uL   nRBC 0.0 0.0 - 0.2 %    Comment: Performed at [facility], [address] [place], [place] [address]  Type and screen [facility]     Status: None   Collection Time: 02/02/21 11:51 AM  Result Value Ref Range   ABO/RH(D) A POS    Antibody Screen NEG    Sample Expiration      02/05/2021,2359 Performed at [facility], [address] [place], [place] [address]   ABO/Rh     Status: None   Collection Time: 02/02/21 11:59 AM  Result Value Ref Range   ABO/RH(D)      A POS Performed at [facility], [address] [place], [place] [address]    No results found.  Review of Systems  Constitutional:  Negative for chills and fever.  HENT:  Negative for congestion, hearing loss and sinus pain.   Respiratory:  Negative for cough, shortness of breath and wheezing.   Cardiovascular:  Negative for chest pain, palpitations and leg swelling.  Gastrointestinal:  Negative  for abdominal pain, constipation, diarrhea, nausea and vomiting.  Genitourinary:  Negative for dysuria, flank pain, frequency, hematuria and urgency.  Musculoskeletal:  Negative for back pain.  Skin:  Negative for rash.  Neurological:  Negative for dizziness and headaches.  Psychiatric/Behavioral:  Negative for suicidal ideas. The patient is not nervous/anxious.    Blood pressure 138/84, pulse 98, temperature 97.7 F (36.5 C), temperature source Temporal, resp. rate 16, height 5\' 9"  (1.753 m), weight 102.1 kg, SpO2 98 %. Physical Exam Vitals and nursing note reviewed.  Constitutional:      Appearance: Normal appearance. She is well-developed.  HENT:     Head: Normocephalic and atraumatic.  Cardiovascular:     Rate and Rhythm: Normal rate and regular rhythm.  Pulmonary:     Effort: Pulmonary effort is normal.     Breath sounds: Normal breath sounds. No wheezing.  Abdominal:     General:  Bowel sounds are normal.     Palpations: Abdomen is soft.  Musculoskeletal:        General: Normal range of motion.  Skin:    General: Skin is warm and dry.  Neurological:     Mental Status: She is alert and oriented to person, place, and time.  Psychiatric:        Behavior: Behavior normal.        Thought Content: Thought content normal.        Judgment: Judgment normal.     Assessment/Plan 40 y.o. G0P0  Patient reports that she desires sterilization. She feels strongly that she does not desire children in the future.   We discussed alternative options for sterilization including long-acting reversible contraception methods. We discussed that sterilization procedures have a failure rate of approximately 1 in  1000.  We discussed potential surgical complications of sterile sterilization including infection damage to surrounding pelvic tissues and risk of bleeding.We discussed that sterilization procedure should be considered non-reversible.  Having a reversal surgery to correct a tubal ligation is expensive, risks and ectopic pregnancy, and has high failure rates.   We discussed options of performing a tubal ligation including removing a segment of the fallopian tube or removing an entire fallopian tube.  We discussed that removal of entire fallopian tube has been associated with a reduction in ovarian cancer risk, however this reduction in is small in her lifetime risk of ovarian cancer is approximately 1 in 100.    Patient feels sure of her decision to have a sterilization procedure.  She desires to have a laparoscopic bilateral salpingectomy.  Note sent to surgical scheduler who will reach out to the patient to schedule surgery.   More than 20 minutes were spent face to face with the patient in the room, reviewing the medical record, labs and images, and coordinating care for the patient. The plan of management was discussed in detail and counseling was provided.   41,  MD 02/02/2021, 1:22 PM

## 2021-02-02 NOTE — Discharge Instructions (Signed)
AMBULATORY SURGERY  ?DISCHARGE INSTRUCTIONS ? ? ?The drugs that you were given will stay in your system until tomorrow so for the next 24 hours you should not: ? ?Drive an automobile ?Make any legal decisions ?Drink any alcoholic beverage ? ? ?You may resume regular meals tomorrow.  Today it is better to start with liquids and gradually work up to solid foods. ? ?You may eat anything you prefer, but it is better to start with liquids, then soup and crackers, and gradually work up to solid foods. ? ? ?Please notify your doctor immediately if you have any unusual bleeding, trouble breathing, redness and pain at the surgery site, drainage, fever, or pain not relieved by medication. ? ? ? ?Additional Instructions: ? ? ? ?Please contact your physician with any problems or Same Day Surgery at [phone], Monday through Friday 6 am to 4 pm, or [place] [place] Main number at [phone].  ?

## 2021-02-02 NOTE — Op Note (Signed)
Operative Note   PRE-OP DIAGNOSIS: Desires Sterilization    POST-OP DIAGNOSIS: Desires Sterilization   SURGEON: Adelene Idler MD  ASSISTANT:  None  ANESTHESIA: General  PROCEDURE: Procedure(s):Robotic assisted laparoscopic bilateral salpingectomy    ESTIMATED BLOOD LOSS: less than 5 cc  DRAINS: Foley  SPECIMENS: Bilateral fallopian tubes    COMPLICATIONS: None  DISPOSITION: PACU  CONDITION: Stable  INDICATIONS: Desires sterilization  FINDINGS: Exam under anesthesia revealed an 6 week uterus. There was without adnexal masses or nodularity. The parametria was smooth. The cervix was negative for gross lesions. Intraoperative findings included: The uterus was  grossly normal. The adnexa was  normal bilaterally. The upper abdomen was  normal including omentum, bowel, liver, stomach, and diaphragmatic surfaces. There was no evidence of grossly enlarged pelvic or right para-aortic lymph nodes.   PROCEDURE IN DETAIL: After informed consent was obtained, the patient was taken to the operating room where anesthesia was obtained without difficulty. The patient was positioned in the dorsal lithotomy position in [place] stirrups and her arms were carefully tucked at her sides and the usual precautions were taken.  She was prepped and draped in normal sterile fashion.  Time-out was performed. A foley catheter was placed. A speculum was placed in the vagina and the cervical os was dilated. The uterus sounded to 6 cm.  A standard zumi uterine manipulator was then placed in the uterus without incident.    Laparoscopic entry was obtained via an umbilical incision and direct entry. The 8 mm robotic optiview port was placed, abdomen insuffulated, and pelvis visualized with noted findings above.  The patient was placed in Trendelenburg and the bowel was displaced up into the upper abdomen.  The 2 additional robotic port were placed in a horizontal line across the upper abdomen. Robotic docking was  performed.  The right and left fallopian tubes were identified. The vessel sealer was used to seal and excise the right and then the left fallopian tube. The fallopian tubes were passed through the robotic port.  Excellent hemostasis was noted.      The instruments were then all removed from the abdomen. The robot was undocked. The air was expelled from the abdomen. The trocars were removed. The skin was closed with 4-0 monocryl with a subcuticular stitch and Indermil glue.  The patient tolerated the procedure well.  Sponge, lap and needle counts were correct x2.  The patient was taken to recovery room in excellent condition.  The foley was removed from the bladder. The uterine manipulator was removed from the uterus.    Adelene Idler MD, [facility], [facility] 02/02/2021 2:49 PM

## 2021-02-02 NOTE — Anesthesia Postprocedure Evaluation (Signed)
Anesthesia Post Note  Patient: Linda [name]  Procedure(s) Performed: XI ROBOTIC ASSISTED SALPINGECTOMY (Bilateral)  Patient location during evaluation: PACU Anesthesia Type: General Level of consciousness: awake and alert Pain management: pain level controlled Vital Signs Assessment: post-procedure vital signs reviewed and stable Respiratory status: spontaneous breathing, nonlabored ventilation, respiratory function stable and patient connected to nasal cannula oxygen Cardiovascular status: blood pressure returned to baseline and stable Postop Assessment: no apparent nausea or vomiting Anesthetic complications: no   No notable events documented.   Last Vitals:  Vitals:   02/02/21 1600 02/02/21 1621  BP: 111/72 115/76  Pulse: 68 74  Resp: 16 14  Temp:  (!) 36.2 C  SpO2: 98% 99%    Last Pain:  Vitals:   02/02/21 1621  TempSrc: Temporal  PainSc:                  Corinda Gubler

## 2021-02-03 ENCOUNTER — Encounter: Payer: Self-pay | Admitting: Obstetrics and Gynecology

## 2021-02-06 LAB — SURGICAL PATHOLOGY

## 2021-02-13 ENCOUNTER — Encounter: Payer: Self-pay | Admitting: Obstetrics and Gynecology

## 2021-02-13 ENCOUNTER — Ambulatory Visit (INDEPENDENT_AMBULATORY_CARE_PROVIDER_SITE_OTHER): Payer: 59 | Admitting: Obstetrics and Gynecology

## 2021-02-13 ENCOUNTER — Other Ambulatory Visit: Payer: Self-pay

## 2021-02-13 VITALS — BP 134/72 | HR 139 | Ht 69.0 in | Wt 225.0 lb

## 2021-02-13 DIAGNOSIS — Z4889 Encounter for other specified surgical aftercare: Secondary | ICD-10-CM

## 2021-02-13 NOTE — Progress Notes (Signed)
  Postoperative Follow-up Patient presents post op from  [facility] Robot-assisted Laparoscopic bilateral salpingectomy   for  sterilization , 1 week ago.  Subjective: Patient reports marked improvement in her preop symptoms. Eating a regular diet without difficulty. The patient is not having any pain.  Activity: normal activities of daily living. Patient reports feelign well since surgery.  Objective: There were no vitals taken for this visit. Physical Exam Constitutional:      Appearance: Normal appearance. She is well-developed.  HENT:     Head: Normocephalic and atraumatic.  Eyes:     Extraocular Movements: Extraocular movements intact.     Pupils: Pupils are equal, round, and reactive to light.  Neck:     Thyroid: No thyromegaly.  Pulmonary:     Effort: Pulmonary effort is normal.  Abdominal:     General: Bowel sounds are normal. There is no distension.     Palpations: Abdomen is soft. There is no mass.     Comments: Incisions are clean dry and intact  Neurological:     Mental Status: She is alert and oriented to person, place, and time.  Skin:    General: Skin is warm and dry.  Psychiatric:        Behavior: Behavior normal.        Thought Content: Thought content normal.        Judgment: Judgment normal.  Vitals reviewed.    Assessment: s/p :  [facility] Robot-assisted Laparoscopic bilateral salpingectomy- stable  Plan: Patient has done well after surgery with no apparent complications.  I have discussed the post-operative course to date, and the expected progress moving forward.  The patient understands what complications to be concerned about.  I will see the patient in routine follow up, or sooner if needed.    Activity plan: No restriction.  Adelene Idler MD, [facility], [facility] 02/13/2021 3:33 PM

## 2021-05-23 ENCOUNTER — Ambulatory Visit (INDEPENDENT_AMBULATORY_CARE_PROVIDER_SITE_OTHER): Payer: 59 | Admitting: Family Medicine

## 2021-05-23 ENCOUNTER — Other Ambulatory Visit: Payer: Self-pay

## 2021-05-23 ENCOUNTER — Encounter: Payer: Self-pay | Admitting: Family Medicine

## 2021-05-23 VITALS — BP 131/82 | HR 93 | Temp 98.8°F | Ht 68.4 in | Wt 227.2 lb

## 2021-05-23 DIAGNOSIS — F339 Major depressive disorder, recurrent, unspecified: Secondary | ICD-10-CM

## 2021-05-23 DIAGNOSIS — R002 Palpitations: Secondary | ICD-10-CM | POA: Diagnosis not present

## 2021-05-23 DIAGNOSIS — Z23 Encounter for immunization: Secondary | ICD-10-CM

## 2021-05-23 DIAGNOSIS — E782 Mixed hyperlipidemia: Secondary | ICD-10-CM

## 2021-05-23 DIAGNOSIS — E079 Disorder of thyroid, unspecified: Secondary | ICD-10-CM

## 2021-05-23 MED ORDER — HYDROXYZINE HCL 25 MG PO TABS: 25.0000 mg | ORAL_TABLET | Freq: Three times a day (TID) | ORAL | 1 refills | Status: AC | PRN

## 2021-05-23 NOTE — Progress Notes (Signed)
BP 131/82   Pulse 93   Temp 98.8 F (37.1 C) (Oral)   Ht 5' 8.4" (1.737 m)   Wt 227 lb 3.2 oz (103.1 kg)   LMP 05/22/2021 (Exact Date)   SpO2 98%   BMI 34.14 kg/m    Subjective:    Patient ID: Linda [name], female    DOB: [birth year], 40 y.o.   MRN: [id number]  HPI: Linda [name] is a 40 y.o. female  Chief Complaint  Patient presents with   Depression   DEPRESSION- doing well. Stopped medicine, but feeling well.  Mood status: better Satisfied with current treatment?: yes Symptom severity: mild  Duration of current treatment : chronic Side effects: no Medication compliance: excellent compliance Psychotherapy/counseling: no  Previous psychiatric medications: wellbutrin Depressed mood: yes Anxious mood: no Anhedonia: no Significant weight loss or gain: no Insomnia: no  Fatigue: yes Feelings of worthlessness or guilt: yes Impaired concentration/indecisiveness: no Suicidal ideations: no Hopelessness: no Crying spells: no Depression screen [facility] 2/9 05/23/2021 11/21/2020 11/02/2019 08/25/2019 02/05/2019  Decreased Interest 1 1 2 2 1   Down, Depressed, Hopeless 1 2 1 2 1   PHQ - 2 Score 2 3 3 4 2   Altered sleeping 1 0 1 1 0  Tired, decreased energy 2 2 1 1 1   Change in appetite 2 1 0 0 1  Feeling bad or failure about yourself  1 2 0 1 0  Trouble concentrating 2 3 2 3 2   Moving slowly or fidgety/restless 0 0 0 0 0  Suicidal thoughts 0 0 0 0 0  PHQ-9 Score 10 11 7 10 6   Difficult doing work/chores Somewhat difficult Somewhat difficult Somewhat difficult Somewhat difficult Somewhat difficult  Some recent data might be hidden   HYPERLIPIDEMIA Hyperlipidemia status: stable Satisfied with current treatment?  yes Side effects:  not on anything Supplements: none Aspirin:  no The 10-year ASCVD risk score (Arnett DK, et al., 2019) is: 4.2%   Values used to calculate the score:     Age: 70 years     Sex: Female     Is Non-Hispanic African American: No     Diabetic: No      Tobacco smoker: Yes     Systolic Blood Pressure: 131 mmHg     Is BP treated: No     HDL Cholesterol: 45 mg/dL     Total Cholesterol: 214 mg/dL Chest pain:  no  PALPITATIONS Duration: months Symptom description: heart racing Duration of episode: seconds Frequency: daily Activity when event occurred:  at random Related to exertion: no Dyspnea: no Chest pain: no Syncope: no Anxiety/stress: yes Nausea/vomiting: no Diaphoresis: no Coronary artery disease: no Congestive heart failure: no Arrhythmia:no Thyroid disease: yes Caffeine intake: none Status:  worse Treatments attempted:cutting soda   Relevant past medical, surgical, family and social history reviewed and updated as indicated. Interim medical history since our last visit reviewed. Allergies and medications reviewed and updated.  Review of Systems  Constitutional: Negative.   HENT: Negative.    Respiratory: Negative.    Cardiovascular:  Positive for palpitations. Negative for chest pain and leg swelling.  Gastrointestinal: Negative.   Skin: Negative.   Psychiatric/Behavioral: Negative.     Per HPI unless specifically indicated above     Objective:    BP 131/82   Pulse 93   Temp 98.8 F (37.1 C) (Oral)   Ht 5' 8.4" (1.737 m)   Wt 227 lb 3.2 oz (103.1 kg)   LMP 05/22/2021 (Exact Date)  SpO2 98%   BMI 34.14 kg/m   Wt Readings from Last 3 Encounters:  05/23/21 227 lb 3.2 oz (103.1 kg)  02/13/21 225 lb (102.1 kg)  02/02/21 225 lb (102.1 kg)    Physical Exam Vitals and nursing note reviewed.  Constitutional:      General: She is not in acute distress.    Appearance: Normal appearance. She is not ill-appearing, toxic-appearing or diaphoretic.  HENT:     Head: Normocephalic and atraumatic.     Right Ear: External ear normal.     Left Ear: External ear normal.     Nose: Nose normal.     Mouth/Throat:     Mouth: Mucous membranes are moist.     Pharynx: Oropharynx is clear.  Eyes:     General: No  scleral icterus.       Right eye: No discharge.        Left eye: No discharge.     Extraocular Movements: Extraocular movements intact.     Conjunctiva/sclera: Conjunctivae normal.     Pupils: Pupils are equal, round, and reactive to light.  Cardiovascular:     Rate and Rhythm: Normal rate and regular rhythm.     Pulses: Normal pulses.     Heart sounds: Normal heart sounds. No murmur heard.   No friction rub. No gallop.  Pulmonary:     Effort: Pulmonary effort is normal. No respiratory distress.     Breath sounds: Normal breath sounds. No stridor. No wheezing, rhonchi or rales.  Chest:     Chest wall: No tenderness.  Musculoskeletal:        General: Normal range of motion.     Cervical back: Normal range of motion and neck supple.  Skin:    General: Skin is warm and dry.     Capillary Refill: Capillary refill takes less than 2 seconds.     Coloration: Skin is not jaundiced or pale.     Findings: No bruising, erythema, lesion or rash.  Neurological:     General: No focal deficit present.     Mental Status: She is alert and oriented to person, place, and time. Mental status is at baseline.  Psychiatric:        Mood and Affect: Mood normal.        Behavior: Behavior normal.        Thought Content: Thought content normal.        Judgment: Judgment normal.    Results for orders placed or performed during the hospital encounter of 02/02/21  CBC  Result Value Ref Range   WBC 9.9 4.0 - 10.5 K/uL   RBC 4.41 3.87 - 5.11 MIL/uL   Hemoglobin 14.4 12.0 - 15.0 g/dL   HCT 18.8 41.6 - 60.6 %   MCV 91.4 80.0 - 100.0 fL   MCH 32.7 26.0 - 34.0 pg   MCHC 35.7 30.0 - 36.0 g/dL   RDW 30.1 60.1 - 09.3 %   Platelets 278 150 - 400 K/uL   nRBC 0.0 0.0 - 0.2 %  Pregnancy, urine POC  Result Value Ref Range   Preg Test, Ur NEGATIVE NEGATIVE  Type and screen [facility]  Result Value Ref Range   ABO/RH(D) A POS    Antibody Screen NEG    Sample Expiration       02/05/2021,2359 Performed at [facility], [address] [place], [place] [address]   ABO/Rh  Result Value Ref Range   ABO/RH(D)  A POS Performed at [facility], [address], [place], [place] [address]   Surgical pathology  Result Value Ref Range   SURGICAL PATHOLOGY      SURGICAL PATHOLOGY CASE: [id number] PATIENT: Judd Gaudier Surgical Pathology Report     Specimen Submitted: A. Fallopian tubes, bilateral  Clinical History: Desires sterilization.      DIAGNOSIS: A. FALLOPIAN TUBES, BILATERAL; LIGATION: - COMPLETE CROSS SECTIONS OF BILATERAL FALLOPIAN TUBES WITH NO SIGNIFICANT PATHOLOGIC ALTERATION. - NEGATIVE FOR MALIGNANCY.   GROSS DESCRIPTION: A. Labeled: Bilateral fallopian tubes Received: Formalin Collection time: 2:19 PM on 02/02/2021 Placed into formalin time: 2:20 PM on 02/02/2021 Size: Fallopian tube fragment A: 6.5 cm long by 0.5 cm in diameter; fallopian tube fragment B: 4 cm long by 0.5 cm in diameter; fallopian tube fragment C: 2.7 cm long by 0.4 cm in diameter Other findings: The fallopian tube fragments have purple-tan, smooth, and glistening serosal surfaces.  Fallopian tube fragments A and B are fimbriated.  Fallopian tube fragment B has a 0.2 x 0.1 x 0.1 cm paratubal cyst.  The lumens are pinpoint an d patent.  Fallopian tube fragments B and C are inked blue and green, respectively, for differentiation.  Block summary: 1 - fallopian tube fragment A fimbria, longitudinally bisected and submitted entirely 2 - representative fallopian tube fragment A cross-sections 3 - fallopian tube fragment B fimbria, longitudinally bisected and submitted entirely, with representative cross-sections 4 - representative fallopian tube fragment C cross-sections  RB 02/03/2021  Final Diagnosis performed by Georgeanna Harrison, MD.   Electronically signed 02/06/2021 10:19:09AM The electronic signature indicates that the named Attending  Pathologist has evaluated the specimen Technical component performed at [facility], [address] [place], [place] [address] Lab: [phone] Dir: Jolene Schimke, MD, MMM  Professional component performed at [facility], [facility], [address] [place], [place], [place] [address] Lab: [phone] Dir: Georgiann Cocker. Oneita Kras, MD       Assessment & Plan:   Problem List Items Addressed This Visit       Endocrine   Thyroid disease    Checking labs today. Await results. Treat as needed.       Relevant Orders   TSH     Other   Hyperlipidemia    Rechecking labs today. Await results. Treat as needed.       Relevant Orders   Comprehensive metabolic panel   Lipid Panel w/o Chol/HDL Ratio   Depression, recurrent (HCC) - Primary    Doing well off medicine. Continue to monitor. Call if getting worse again.       Relevant Medications   hydrOXYzine (ATARAX) 25 MG tablet   Other Relevant Orders   TSH   Other Visit Diagnoses     Palpitations       Discussed holter vs b-blocker. Would like to continue to monitor and check labs. If worsening or going >160 will call.    Relevant Orders   CBC with Differential/Platelet   Need for influenza vaccination       Relevant Orders   Flu Vaccine QUAD 2mo+IM (Fluarix, Fluzone & Alfiuria Quad PF) (Completed)        Follow up plan: Return in about 6 months (around 11/21/2021), or physical.

## 2021-05-23 NOTE — Assessment & Plan Note (Signed)
Checking labs today. Await results. Treat as needed.  

## 2021-05-23 NOTE — Assessment & Plan Note (Signed)
Doing well off medicine. Continue to monitor. Call if getting worse again.

## 2021-05-23 NOTE — Assessment & Plan Note (Signed)
Rechecking labs today. Await results. Treat as needed.  °

## 2021-05-24 LAB — CBC WITH DIFFERENTIAL/PLATELET
Basophils Absolute: 0.1 10*3/uL (ref 0.0–0.2)
Basos: 1 %
EOS (ABSOLUTE): 0.4 10*3/uL (ref 0.0–0.4)
Eos: 4 %
Hematocrit: 45.6 % (ref 34.0–46.6)
Hemoglobin: 15.5 g/dL (ref 11.1–15.9)
Immature Grans (Abs): 0.1 10*3/uL (ref 0.0–0.1)
Immature Granulocytes: 1 %
Lymphocytes Absolute: 3.6 10*3/uL — ABNORMAL HIGH (ref 0.7–3.1)
Lymphs: 35 %
MCH: 32 pg (ref 26.6–33.0)
MCHC: 34 g/dL (ref 31.5–35.7)
MCV: 94 fL (ref 79–97)
Monocytes Absolute: 0.7 10*3/uL (ref 0.1–0.9)
Monocytes: 7 %
Neutrophils Absolute: 5.5 10*3/uL (ref 1.4–7.0)
Neutrophils: 52 %
Platelets: 273 10*3/uL (ref 150–450)
RBC: 4.84 x10E6/uL (ref 3.77–5.28)
RDW: 12.7 % (ref 11.7–15.4)
WBC: 10.2 10*3/uL (ref 3.4–10.8)

## 2021-05-24 LAB — COMPREHENSIVE METABOLIC PANEL
ALT: 31 IU/L (ref 0–32)
AST: 23 IU/L (ref 0–40)
Albumin/Globulin Ratio: 1.7 (ref 1.2–2.2)
Albumin: 4.3 g/dL (ref 3.8–4.8)
Alkaline Phosphatase: 75 IU/L (ref 44–121)
BUN/Creatinine Ratio: 13 (ref 9–23)
BUN: 10 mg/dL (ref 6–24)
Bilirubin Total: 0.6 mg/dL (ref 0.0–1.2)
CO2: 22 mmol/L (ref 20–29)
Calcium: 9.5 mg/dL (ref 8.7–10.2)
Chloride: 103 mmol/L (ref 96–106)
Creatinine, Ser: 0.76 mg/dL (ref 0.57–1.00)
Globulin, Total: 2.5 g/dL (ref 1.5–4.5)
Glucose: 86 mg/dL (ref 70–99)
Potassium: 4.4 mmol/L (ref 3.5–5.2)
Sodium: 138 mmol/L (ref 134–144)
Total Protein: 6.8 g/dL (ref 6.0–8.5)
eGFR: 102 mL/min/{1.73_m2} (ref >59)

## 2021-05-24 LAB — LIPID PANEL W/O CHOL/HDL RATIO
Cholesterol, Total: 233 mg/dL — ABNORMAL HIGH (ref 100–199)
HDL: 40 mg/dL (ref >39)
LDL Chol Calc (NIH): 157 mg/dL — ABNORMAL HIGH (ref 0–99)
Triglycerides: 198 mg/dL — ABNORMAL HIGH (ref 0–149)
VLDL Cholesterol Cal: 36 mg/dL (ref 5–40)

## 2021-05-24 LAB — TSH: TSH: 1.49 u[IU]/mL (ref 0.450–4.500)
